# Patient Record
Sex: Female | Born: 1970 | Race: White | Hispanic: No | Marital: Married | Smoking: Former smoker
Health system: Southern US, Community
[De-identification: ages and names within clinical notes are randomized; demographics above are authoritative.]

## PROBLEM LIST (undated history)

## (undated) DIAGNOSIS — K900 Celiac disease: Secondary | ICD-10-CM

## (undated) DIAGNOSIS — D839 Common variable immunodeficiency, unspecified: Secondary | ICD-10-CM

## (undated) DIAGNOSIS — D696 Thrombocytopenia, unspecified: Secondary | ICD-10-CM

## (undated) DIAGNOSIS — R590 Localized enlarged lymph nodes: Secondary | ICD-10-CM

## (undated) DIAGNOSIS — R197 Diarrhea, unspecified: Secondary | ICD-10-CM

## (undated) DIAGNOSIS — E039 Hypothyroidism, unspecified: Secondary | ICD-10-CM

## (undated) HISTORY — DX: Celiac disease: K90.0

## (undated) HISTORY — PX: TONSILLECTOMY: SUR1361

## (undated) HISTORY — DX: Hypothyroidism, unspecified: E03.9

## (undated) HISTORY — DX: Diarrhea, unspecified: R19.7

## (undated) HISTORY — DX: Localized enlarged lymph nodes: R59.0

## (undated) HISTORY — DX: Thrombocytopenia, unspecified: D69.6

## (undated) HISTORY — DX: Common variable immunodeficiency, unspecified: D83.9

---

## 1997-02-05 HISTORY — PX: THYROID SURGERY: SHX805

## 2004-07-22 ENCOUNTER — Emergency Department: Payer: Self-pay | Admitting: Emergency Medicine

## 2005-01-31 ENCOUNTER — Inpatient Hospital Stay: Payer: Self-pay

## 2005-05-23 ENCOUNTER — Ambulatory Visit: Payer: Self-pay | Admitting: Gastroenterology

## 2006-07-03 ENCOUNTER — Ambulatory Visit: Payer: Self-pay

## 2006-07-08 DIAGNOSIS — D696 Thrombocytopenia, unspecified: Secondary | ICD-10-CM

## 2006-07-08 HISTORY — DX: Thrombocytopenia, unspecified: D69.6

## 2006-10-16 ENCOUNTER — Encounter: Payer: Self-pay | Admitting: Maternal & Fetal Medicine

## 2006-12-13 ENCOUNTER — Ambulatory Visit: Payer: Self-pay | Admitting: Emergency Medicine

## 2006-12-28 ENCOUNTER — Ambulatory Visit: Payer: Self-pay | Admitting: Emergency Medicine

## 2007-03-25 ENCOUNTER — Observation Stay: Payer: Self-pay | Admitting: Obstetrics and Gynecology

## 2007-04-15 ENCOUNTER — Inpatient Hospital Stay: Payer: Self-pay | Admitting: Obstetrics and Gynecology

## 2007-07-09 DIAGNOSIS — R590 Localized enlarged lymph nodes: Secondary | ICD-10-CM

## 2007-07-09 HISTORY — DX: Localized enlarged lymph nodes: R59.0

## 2007-11-06 ENCOUNTER — Ambulatory Visit: Payer: Self-pay | Admitting: Internal Medicine

## 2007-11-23 ENCOUNTER — Ambulatory Visit: Payer: Self-pay | Admitting: Internal Medicine

## 2007-12-07 ENCOUNTER — Ambulatory Visit: Payer: Self-pay | Admitting: Internal Medicine

## 2008-01-06 ENCOUNTER — Ambulatory Visit: Payer: Self-pay | Admitting: Internal Medicine

## 2008-01-06 HISTORY — PX: GALLBLADDER SURGERY: SHX652

## 2008-02-06 ENCOUNTER — Ambulatory Visit: Payer: Self-pay | Admitting: Internal Medicine

## 2008-02-10 ENCOUNTER — Ambulatory Visit: Payer: Self-pay | Admitting: Otolaryngology

## 2008-02-15 ENCOUNTER — Inpatient Hospital Stay: Payer: Self-pay | Admitting: Otolaryngology

## 2008-02-18 ENCOUNTER — Ambulatory Visit: Payer: Self-pay | Admitting: Otolaryngology

## 2008-02-29 ENCOUNTER — Other Ambulatory Visit: Payer: Self-pay

## 2008-02-29 ENCOUNTER — Emergency Department: Payer: Self-pay | Admitting: Emergency Medicine

## 2008-03-01 ENCOUNTER — Ambulatory Visit: Payer: Self-pay | Admitting: Otolaryngology

## 2008-03-08 ENCOUNTER — Ambulatory Visit: Payer: Self-pay | Admitting: Internal Medicine

## 2008-03-10 ENCOUNTER — Ambulatory Visit: Payer: Self-pay | Admitting: Otolaryngology

## 2008-04-07 ENCOUNTER — Ambulatory Visit: Payer: Self-pay | Admitting: Internal Medicine

## 2008-04-25 ENCOUNTER — Ambulatory Visit: Payer: Self-pay | Admitting: Internal Medicine

## 2008-05-08 ENCOUNTER — Ambulatory Visit: Payer: Self-pay | Admitting: Internal Medicine

## 2008-07-05 ENCOUNTER — Ambulatory Visit: Payer: Self-pay | Admitting: Specialist

## 2008-07-26 ENCOUNTER — Ambulatory Visit: Payer: Self-pay | Admitting: Internal Medicine

## 2008-08-08 ENCOUNTER — Ambulatory Visit: Payer: Self-pay | Admitting: Internal Medicine

## 2008-08-11 ENCOUNTER — Ambulatory Visit: Payer: Self-pay | Admitting: Internal Medicine

## 2008-08-22 ENCOUNTER — Ambulatory Visit: Payer: Self-pay | Admitting: Internal Medicine

## 2008-09-05 ENCOUNTER — Ambulatory Visit: Payer: Self-pay | Admitting: Internal Medicine

## 2008-11-05 ENCOUNTER — Ambulatory Visit: Payer: Self-pay | Admitting: Internal Medicine

## 2008-11-07 ENCOUNTER — Ambulatory Visit: Payer: Self-pay | Admitting: Internal Medicine

## 2008-11-08 ENCOUNTER — Inpatient Hospital Stay: Payer: Self-pay | Admitting: Internal Medicine

## 2008-12-06 ENCOUNTER — Ambulatory Visit: Payer: Self-pay | Admitting: Internal Medicine

## 2008-12-06 ENCOUNTER — Inpatient Hospital Stay: Payer: Self-pay | Admitting: Specialist

## 2008-12-13 ENCOUNTER — Ambulatory Visit: Payer: Self-pay | Admitting: Internal Medicine

## 2009-01-23 ENCOUNTER — Ambulatory Visit: Payer: Self-pay | Admitting: Internal Medicine

## 2009-02-08 ENCOUNTER — Ambulatory Visit: Payer: Self-pay | Admitting: Gastroenterology

## 2009-05-22 ENCOUNTER — Encounter: Payer: Self-pay | Admitting: Obstetrics & Gynecology

## 2009-07-08 LAB — HM PAP SMEAR: HM Pap smear: NORMAL

## 2009-07-11 ENCOUNTER — Inpatient Hospital Stay: Payer: Self-pay | Admitting: Internal Medicine

## 2009-08-07 ENCOUNTER — Encounter: Payer: Self-pay | Admitting: Maternal & Fetal Medicine

## 2009-08-08 ENCOUNTER — Encounter: Payer: Self-pay | Admitting: Maternal & Fetal Medicine

## 2009-08-10 ENCOUNTER — Emergency Department: Payer: Self-pay | Admitting: Emergency Medicine

## 2009-09-04 ENCOUNTER — Encounter: Payer: Self-pay | Admitting: Obstetrics and Gynecology

## 2009-09-05 ENCOUNTER — Encounter: Payer: Self-pay | Admitting: Pediatric Cardiology

## 2009-09-05 ENCOUNTER — Encounter: Payer: Self-pay | Admitting: Obstetrics and Gynecology

## 2009-09-14 ENCOUNTER — Inpatient Hospital Stay: Payer: Self-pay | Admitting: Internal Medicine

## 2009-09-25 ENCOUNTER — Encounter: Payer: Self-pay | Admitting: Obstetrics and Gynecology

## 2009-09-25 ENCOUNTER — Inpatient Hospital Stay: Payer: Self-pay | Admitting: Internal Medicine

## 2010-02-13 IMAGING — US ULTRASOUND RIGHT BREAST
1 series · 17 of 25 positions shown · non-contrast
Comparison: none

REASON FOR EXAM: new found right breast lump
COMMENTS:

[Series 1: ultrasound right breast · 17 of 38 slices shown]
[im 1/38]
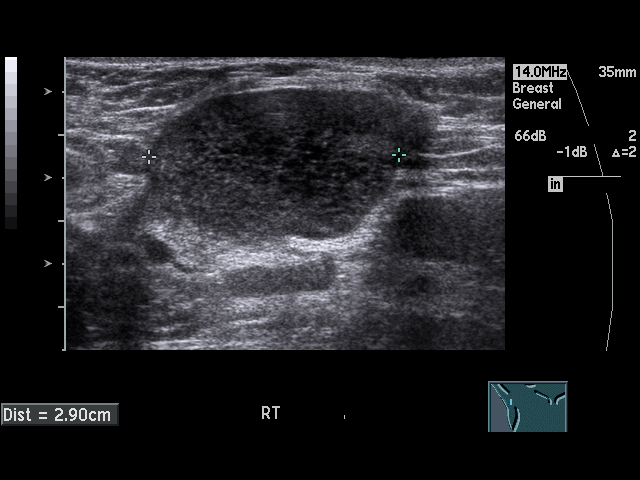
[im 4/38]
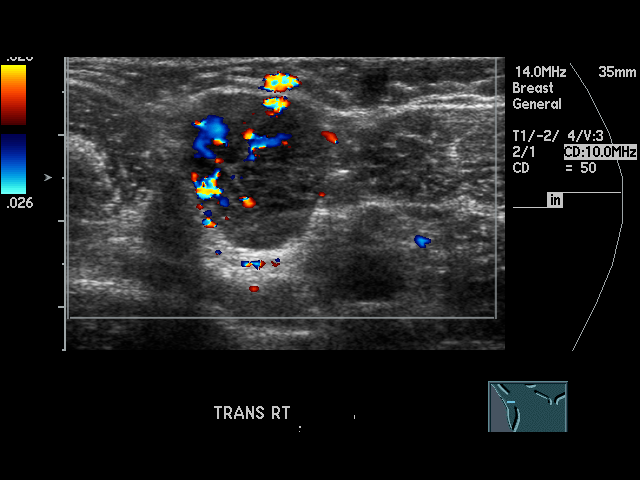
[im 5/38]
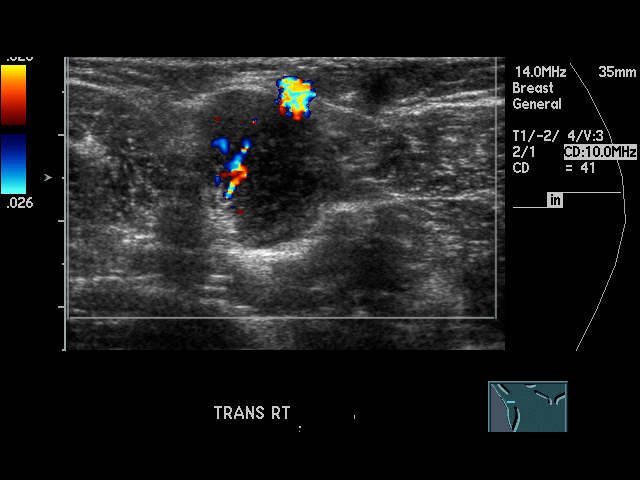
[im 8/38]
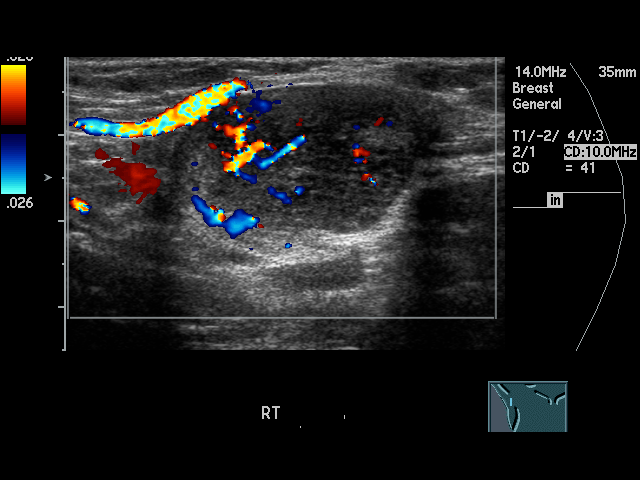
[im 10/38]
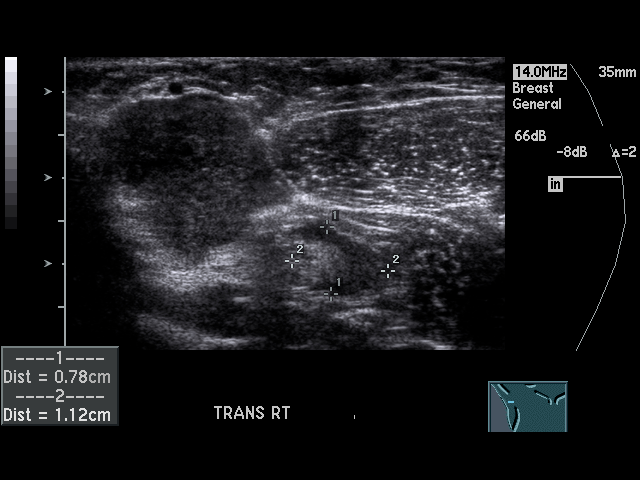
[im 13/38]
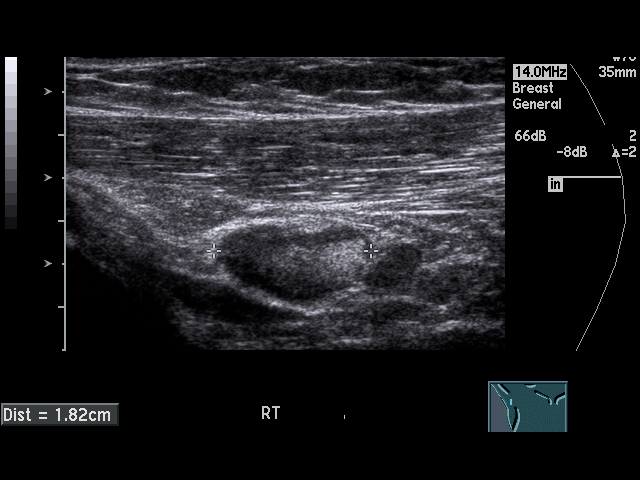
[im 14/38]
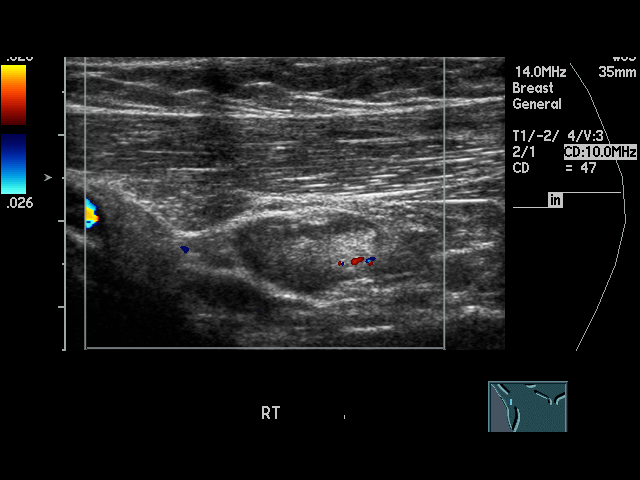
[im 17/38]
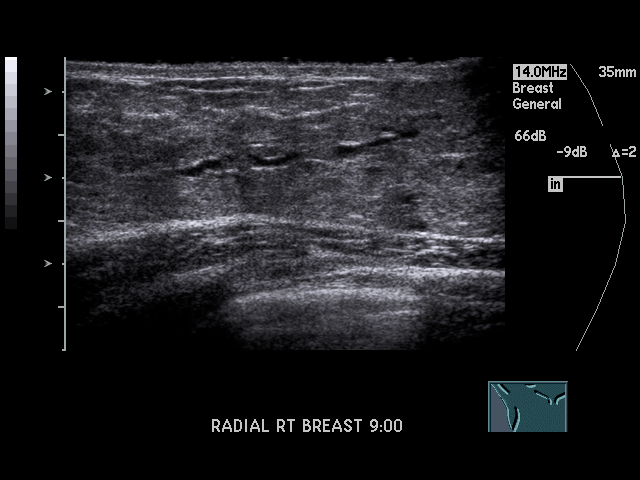
[im 19/38]
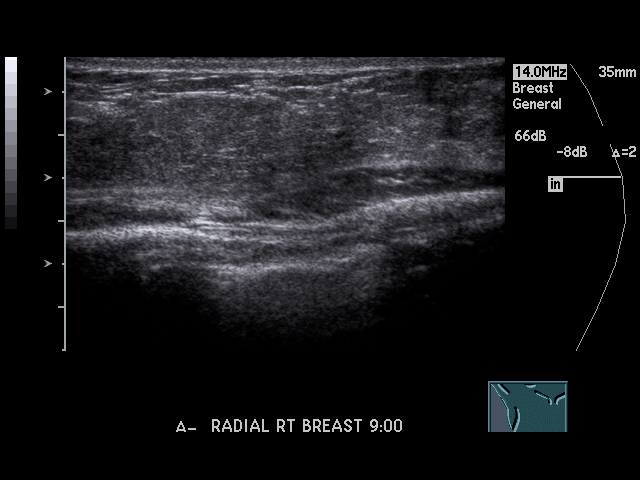
[im 21/38]
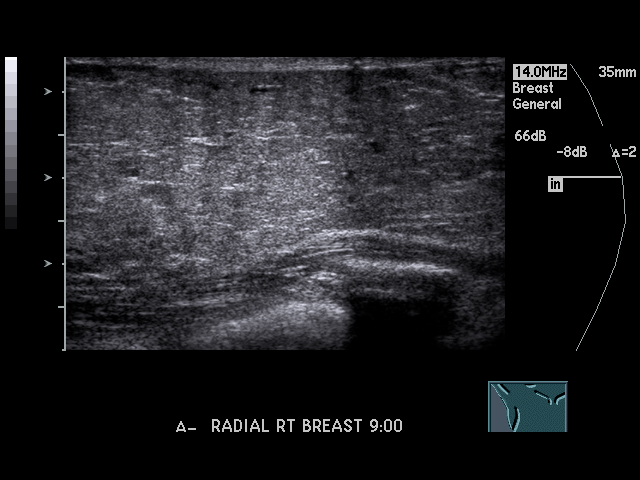
[im 24/38]
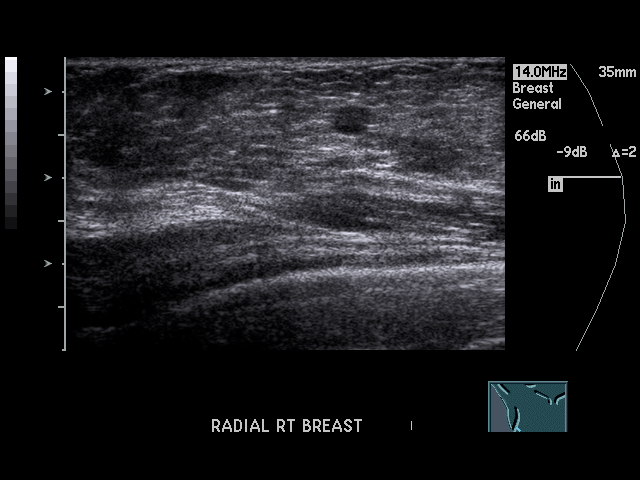
[im 25/38]
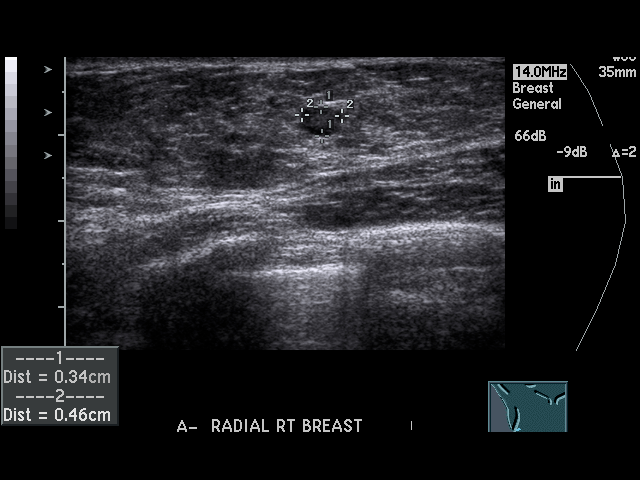
[im 28/38]
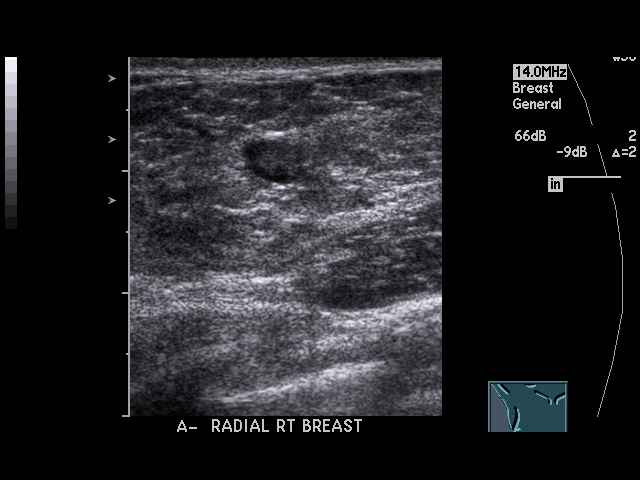
[im 30/38]
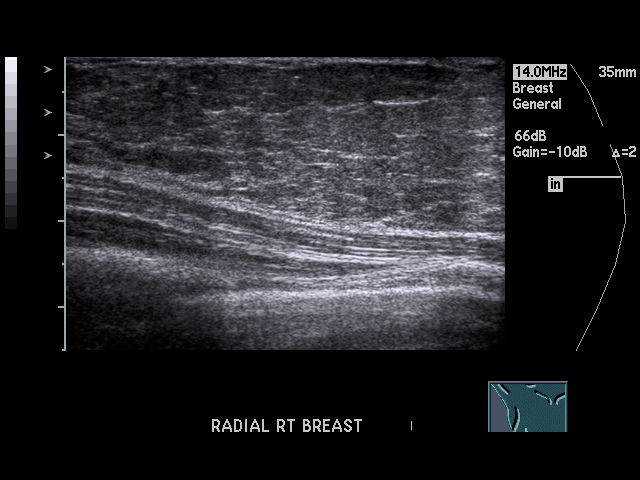
[im 33/38]
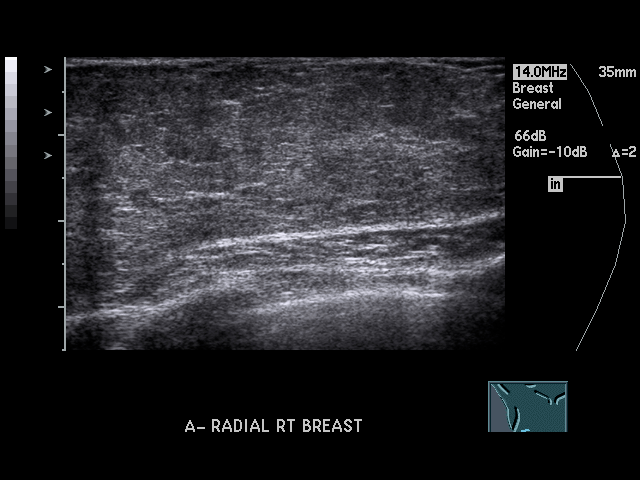
[im 34/38]
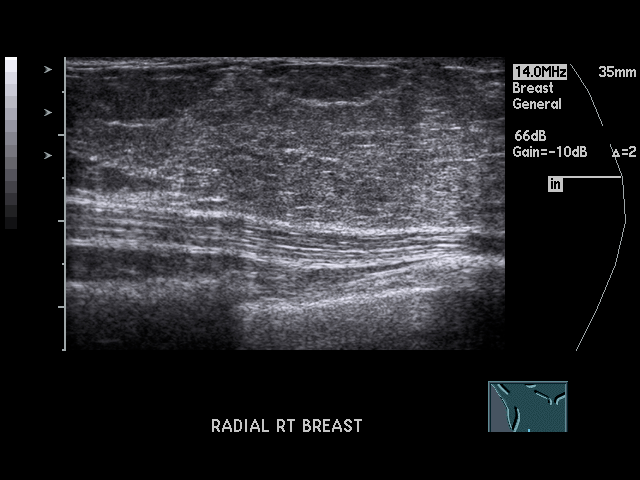
[im 38/38]
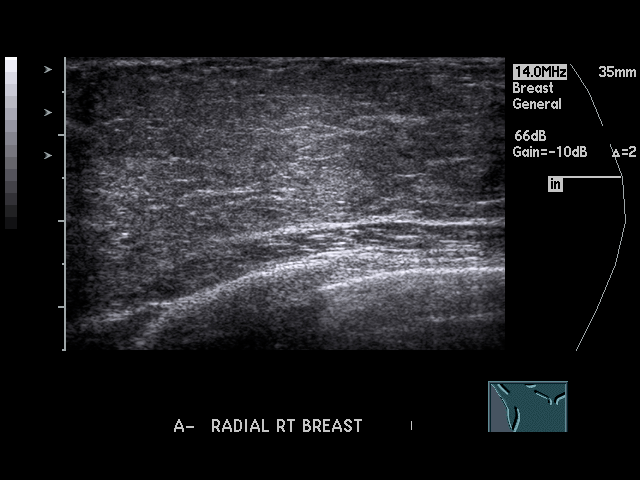

[17 of 25 positions shown; findings below may reference images not displayed]

PROCEDURE:     US  - US BREAST RIGHT  - November 24, 2007 [DATE]

RESULT:     Sonographic interrogation of the RIGHT axillary region and upper
outer quadrant of the RIGHT breast is performed. There is an enlarged lymph
node in the RIGHT axilla which is the palpable area of abnormality. This
measures 1.8 x 1.7 x 2.9 cm. A small elliptical smoothly marginated area of
decreased echogenicity is seen in the 10 o'clock region of the RIGHT breast
measuring 4.9 x 3.4 x 4.6 mm.  There appears to be a fatty hilum and this is
suggestive of an intramammary lymph node without enlargement. No other
significant mammographic abnormality is seen. A non-enlarged lymph node is
seen in the RIGHT axilla adjacent to the enlarged lymph node described
above.
IMPRESSION: 1.Adenopathy in the RIGHT axilla. No definite breast parenchymal sonographic
abnormality. Clinical correlation and follow-up is recommended.

BI-RADS: Category 2-Benign Finding

## 2010-06-07 ENCOUNTER — Ambulatory Visit: Payer: Self-pay | Admitting: Family Medicine

## 2010-06-08 ENCOUNTER — Inpatient Hospital Stay: Payer: Self-pay | Admitting: Internal Medicine

## 2010-06-27 ENCOUNTER — Inpatient Hospital Stay: Payer: Self-pay | Admitting: Internal Medicine

## 2010-07-16 ENCOUNTER — Ambulatory Visit: Payer: Self-pay | Admitting: Internal Medicine

## 2010-09-27 ENCOUNTER — Ambulatory Visit: Payer: Self-pay | Admitting: Internal Medicine

## 2010-09-28 ENCOUNTER — Inpatient Hospital Stay: Payer: Self-pay | Admitting: Internal Medicine

## 2010-10-05 ENCOUNTER — Inpatient Hospital Stay: Payer: Self-pay | Admitting: Internal Medicine

## 2010-11-06 DIAGNOSIS — D839 Common variable immunodeficiency, unspecified: Secondary | ICD-10-CM

## 2010-11-06 HISTORY — DX: Common variable immunodeficiency, unspecified: D83.9

## 2010-11-12 ENCOUNTER — Inpatient Hospital Stay: Payer: Self-pay | Admitting: Internal Medicine

## 2011-02-25 ENCOUNTER — Ambulatory Visit (INDEPENDENT_AMBULATORY_CARE_PROVIDER_SITE_OTHER): Payer: BC Managed Care – PPO | Admitting: Internal Medicine

## 2011-02-25 ENCOUNTER — Encounter: Payer: Self-pay | Admitting: Internal Medicine

## 2011-02-25 VITALS — BP 118/60 | HR 64 | Temp 98.7°F | Resp 14 | Ht 67.0 in | Wt 127.0 lb

## 2011-02-25 DIAGNOSIS — D839 Common variable immunodeficiency, unspecified: Secondary | ICD-10-CM

## 2011-02-25 DIAGNOSIS — K900 Celiac disease: Secondary | ICD-10-CM

## 2011-02-25 DIAGNOSIS — E039 Hypothyroidism, unspecified: Secondary | ICD-10-CM

## 2011-02-25 NOTE — Assessment & Plan Note (Addendum)
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 She has had initiation of IVIG therapy with good tolerance and clinical improvement.  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  I have signed her disability form which will need monthly submissions to her former employer to continue her disability benefits.

## 2011-02-25 NOTE — Progress Notes (Signed)
Subjective:    Patient ID: Ashley [name], female    DOB: [birth year], 40 y.o.   MRN: [id number]  HPI    Review of Systems     Objective:   Physical Exam        Assessment & Plan:   Subjective:     Ashley [name] is a 40 y.o. female who presents for follow up of hypothyroidism. Current symptoms: none. Patient denies change in energy level. Symptoms have stabilized.  The following portions of the patient's history were reviewed and updated as appropriate: allergies, current medications, past family history, past medical history, past social history, past surgical history and problem list.  Review of Systems A comprehensive review of systems was negative.    Objective:    BP 118/60  Pulse 64  Temp(Src) 98.7 F (37.1 C) (Oral)  Resp 14  Ht 5\' 7"  (1.702 m)  Wt 127 lb (57.607 kg)  BMI 19.89 kg/m2  LMP 02/08/2011  General Appearance:    Alert, cooperative, no distress, appears stated age  Head:    Normocephalic, without obvious abnormality, atraumatic  Eyes:    PERRL, conjunctiva/corneas clear, EOM's intact, fundi    benign, both eyes  Ears:    Normal TM's and external ear canals, both ears  Nose:   Nares normal, septum midline, mucosa normal, no drainage    or sinus tenderness  Throat:   Lips, mucosa, and tongue normal; teeth and gums normal  Neck:   Supple, symmetrical, trachea midline, no adenopathy;    thyroid:  no enlargement/tenderness/nodules; no carotid   bruit or JVD  Back:     Symmetric, no curvature, ROM normal, no CVA tenderness  Lungs:     Clear to auscultation bilaterally, respirations unlabored  Chest Wall:    No tenderness or deformity   Heart:    Regular rate and rhythm, S1 and S2 normal, no murmur, rub   or gallop     Abdomen:     Soft, non-tender, bowel sounds active all four quadrants,    no masses, no organomegaly        Extremities:   Extremities normal, atraumatic, no cyanosis or edema  Pulses:   2+ and symmetric all extremities  Skin:    Skin color, texture, turgor normal, no rashes or lesions  Lymph nodes:   Cervical, supraclavicular, and axillary nodes normal  Neurologic:   CNII-XII intact, normal strength, sensation and reflexes    throughout    Laboratory: No results found for this basename: TSH      Assessment:    Hypothyroidism.  Replacement therapy with Synthroid.      Plan:    1. L-thyroxine per orders. 2. Recheck thyroid function tests in 6 weeks. 3. Instructed not to take multivitamins or iron within 4 hours of taking thyroid medications. 4. Follow up in 3 months.   Subjective:     Ashley [name] is a 40 y.o. female who presents for follow up of hypothyroidism. Current symptoms: none. Patient denies change in energy level. Symptoms have stabilized.  The following portions of the patient's history were reviewed and updated as appropriate: allergies, current medications, past family history, past medical history, past social history, past surgical history and problem list.  Review of Systems Pertinent items are noted in HPI.    Objective:    BP 118/60  Pulse 64  Temp(Src) 98.7 F (37.1 C) (Oral)  Resp 14  Ht 5\' 7"  (1.702 m)  Wt 127 lb (57.607 kg)  BMI 19.89 kg/m2  LMP 02/08/2011  General Appearance:    Alert, cooperative, no distress, appears stated age  Head:    Normocephalic, without obvious abnormality, atraumatic  Eyes:    PERRL, conjunctiva/corneas clear, EOM's intact, fundi    benign, both eyes  Ears:    Normal TM's and external ear canals, both ears  Nose:   Nares normal, septum midline, mucosa normal, no drainage    or sinus tenderness  Throat:   Lips, mucosa, and tongue normal; teeth and gums normal  Neck:   Supple, symmetrical, trachea midline, no adenopathy;    thyroid:  no enlargement/tenderness/nodules; no carotid   bruit or JVD  Back:     Symmetric, no curvature, ROM normal, no CVA tenderness  Lungs:     Clear to auscultation bilaterally, respirations unlabored  Chest  Wall:    No tenderness or deformity   Heart:    Regular rate and rhythm, S1 and S2 normal, no murmur, rub   or gallop     Abdomen:     Soft, non-tender, bowel sounds active all four quadrants,    no masses, no organomegaly        Extremities:   Extremities normal, atraumatic, no cyanosis or edema  Pulses:   2+ and symmetric all extremities  Skin:   Skin color, texture, turgor normal, no rashes or lesions  Lymph nodes:   Cervical, supraclavicular, and axillary nodes normal  Neurologic:   CNII-XII intact, normal strength, sensation and reflexes    throughout    Laboratory:     Assessment:    Hypothyroidism.  Replacement therapy with Synthroid.    Plan:    1. L-thyroxine per orders. 2. Recheck thyroid function tests in 6 weeks. 3. Instructed not to take multivitamins or iron within 4 hours of taking thyroid medications. 4. Follow up in 3 months.

## 2011-02-25 NOTE — Assessment & Plan Note (Signed)
Diagnosed by endoscopy done in May for evaluation of Crohn's disease.  Records requested.

## 2011-02-25 NOTE — Assessment & Plan Note (Signed)
Her TSH was 62 in January and no subsequent followup lab is available.  Rechecking today on 200 mcg daily dose.

## 2011-02-26 NOTE — Progress Notes (Signed)
Addended by: Jobie Quaker on: 02/26/2011 03:29 PM   Modules accepted: Orders

## 2011-02-27 LAB — TSH: TSH: 0.23 u[IU]/mL — ABNORMAL LOW (ref 0.35–5.50)

## 2011-02-27 MED ORDER — LEVOTHYROXINE SODIUM 175 MCG PO TABS: 175.0000 ug | ORAL_TABLET | Freq: Every day | ORAL | Status: DC

## 2011-02-27 NOTE — Progress Notes (Signed)
Addended by: Duncan Dull on: 02/27/2011 04:06 PM   Modules accepted: Orders

## 2011-03-04 MED ORDER — LEVOTHYROXINE SODIUM 175 MCG PO TABS: 175.0000 ug | ORAL_TABLET | Freq: Every day | ORAL | Status: DC

## 2011-03-04 NOTE — Progress Notes (Signed)
Addended by: Darletta Moll A on: 03/04/2011 12:07 PM   Modules accepted: Orders

## 2011-04-11 ENCOUNTER — Other Ambulatory Visit (INDEPENDENT_AMBULATORY_CARE_PROVIDER_SITE_OTHER): Payer: BC Managed Care – PPO | Admitting: *Deleted

## 2011-04-11 DIAGNOSIS — Z0279 Encounter for issue of other medical certificate: Secondary | ICD-10-CM

## 2011-04-11 DIAGNOSIS — E039 Hypothyroidism, unspecified: Secondary | ICD-10-CM

## 2011-04-15 ENCOUNTER — Other Ambulatory Visit: Payer: Self-pay | Admitting: Internal Medicine

## 2011-04-15 MED ORDER — LEVOTHYROXINE SODIUM 150 MCG PO TABS: 150.0000 ug | ORAL_TABLET | Freq: Every day | ORAL | Status: DC

## 2011-06-14 DIAGNOSIS — Z0279 Encounter for issue of other medical certificate: Secondary | ICD-10-CM

## 2011-07-26 ENCOUNTER — Telehealth: Payer: Self-pay | Admitting: *Deleted

## 2011-07-26 NOTE — Telephone Encounter (Signed)
Pt has brought in disability forms for completion, forms are in your red folder.

## 2011-08-05 NOTE — Telephone Encounter (Signed)
Forms have been faxed to fax number [phone].

## 2011-08-06 NOTE — Telephone Encounter (Signed)
Spoke with patient, she doesn't want the original, originals sent for scanning.

## 2011-08-20 ENCOUNTER — Ambulatory Visit (INDEPENDENT_AMBULATORY_CARE_PROVIDER_SITE_OTHER): Payer: BC Managed Care – PPO | Admitting: Internal Medicine

## 2011-08-20 ENCOUNTER — Encounter: Payer: Self-pay | Admitting: Internal Medicine

## 2011-08-20 VITALS — BP 98/60 | HR 76 | Temp 98.2°F | Wt 121.0 lb

## 2011-08-20 DIAGNOSIS — K900 Celiac disease: Secondary | ICD-10-CM

## 2011-08-20 DIAGNOSIS — E039 Hypothyroidism, unspecified: Secondary | ICD-10-CM

## 2011-08-20 DIAGNOSIS — J40 Bronchitis, not specified as acute or chronic: Secondary | ICD-10-CM

## 2011-08-20 DIAGNOSIS — Z1239 Encounter for other screening for malignant neoplasm of breast: Secondary | ICD-10-CM

## 2011-08-20 MED ORDER — AZITHROMYCIN 500 MG PO TABS: 500.0000 mg | ORAL_TABLET | Freq: Every day | ORAL | Status: AC

## 2011-08-20 MED ORDER — HYDROCOD POLST-CHLORPHEN POLST 10-8 MG/5ML PO LQCR: 5.0000 mL | Freq: Two times a day (BID) | ORAL | Status: DC | PRN

## 2011-08-20 MED ORDER — PREDNISONE (PAK) 10 MG PO TABS: ORAL_TABLET | ORAL | Status: AC

## 2011-08-20 NOTE — Progress Notes (Signed)
Subjective:    Patient ID: Ashley [name], female    DOB: July 10, 1970, 41 y.o.   MRN: [id number]  HPI  Ashley [name] is a 40 yr ld white female recently diagnosed with CVID, now receiving treatment with weekly IVIg with marked improvement, no hospitalizations since last August, presents for followup on chronic conditions.  She underwent a liver biopsy recently due to splenomegaly which was noted during GI evaluation for a mass in stomach .  Everything has been benign and the liver biopsy is pending.  She had a Colonoscopy and EGD.  Dr Karel Jarvis is her gastroenterologist and he specializes in immunodeficiency.  She has not had a thyroid  Level checked in over 8 months. 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  Past Medical History  Diagnosis Date  . Thrombocytopenia 2008    CT scan done for enlarged spleen found diffuse LAD, axillary node biopsied and reactive, benign  . Reactive cervical lymphadenopathy 2009    negative neck, axillary and bone marrow biopsy  . Hypothyroidism   . 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 Celiac disease     by endoscopy May 2012   Current Outpatient Prescriptions on File Prior to Visit  Medication Sig Dispense Refill  . albuterol (PROVENTIL) (2.5 MG/3ML) 0.083% nebulizer solution Take 2.5 mg by nebulization every 6 (six) hours as needed. For wheezing       . albuterol (VENTOLIN HFA) 108 (90 BASE) MCG/ACT inhaler Inhale 2 puffs into the lungs 4 (four) times daily as needed.        . budesonide-formoterol (SYMBICORT) 160-4.5 MCG/ACT inhaler Inhale 2 puffs into the lungs 2 (two) times daily.        . Calcium Carb-Cholecalciferol (CALCIUM 1000 + D PO) Take 2 tablets by mouth daily.        . Immune Globulin, Human, (GAMUNEX  IV) Inject into the vein once a week.        . levothyroxine (SYNTHROID) 150 MCG tablet Take 1 tablet (150 mcg total) by mouth daily.  30 tablet  3  . budesonide (ENTOCORT EC) 3 MG 24 hr capsule Take 6 mg by mouth. Take 3 capsules by mouth every day for 4 weeks, then 2 capsules daily for 4 weeks, then 1 capsule daily for 4 weeks then stop.         Review of Systems  Constitutional: Negative for fever, chills and unexpected weight change.  HENT: Negative for hearing loss, ear pain, nosebleeds, congestion, sore throat, facial swelling, rhinorrhea, sneezing, mouth sores, trouble swallowing, neck pain, neck stiffness, voice change, postnasal drip, sinus pressure, tinnitus and ear discharge.   Eyes: Negative for pain, discharge, redness and visual disturbance.  Respiratory: Negative for cough, chest tightness, shortness of breath, wheezing and stridor.   Cardiovascular: Negative for chest pain, palpitations and leg swelling.  Musculoskeletal: Negative for myalgias and arthralgias.  Skin: Negative for color change and rash.  Neurological: Negative for dizziness, weakness, light-headedness and headaches.  Hematological: Negative for adenopathy.       Objective:   Physical Exam  Constitutional: She is oriented to person, place, and time. She appears well-developed and well-nourished.  HENT:  Mouth/Throat: Oropharynx is clear and moist.  Eyes: EOM are  normal. Pupils are equal, round, and reactive to light. No scleral icterus.  Neck: Normal range of motion. Neck supple. No JVD present. No thyromegaly present.  Cardiovascular: Normal rate, regular rhythm, normal heart sounds and intact distal pulses.   Pulmonary/Chest: Effort normal. She has wheezes. She has rales.  Abdominal: Soft. Bowel sounds are normal. She exhibits no mass. There is no tenderness.  Musculoskeletal: Normal range of motion. She exhibits no edema.  Lymphadenopathy:    She has no cervical adenopathy.  Neurological: She is alert  and oriented to person, place, and time.  Skin: Skin is warm and dry.  Psychiatric: She has a normal mood and affect.       Bronchitis Will treat with empiric antibiotics and steroids  Hypothyroidism She is due for a repeat TSH  Celiac disease She has had an improvement in her chronic skin condition with following a gluten free diet but has lost 6 lbs,  BMI < 19 currently    Updated Medication List Outpatient Encounter Prescriptions as of 08/20/2011  Medication Sig Dispense Refill  . albuterol (PROVENTIL) (2.5 MG/3ML) 0.083% nebulizer solution Take 2.5 mg by nebulization every 6 (six) hours as needed. For wheezing       . albuterol (VENTOLIN HFA) 108 (90 BASE) MCG/ACT inhaler Inhale 2 puffs into the lungs 4 (four) times daily as needed.        . budesonide-formoterol (SYMBICORT) 160-4.5 MCG/ACT inhaler Inhale 2 puffs into the lungs 2 (two) times daily.        . Calcium Carb-Cholecalciferol (CALCIUM 1000 + D PO) Take 2 tablets by mouth daily.        . Immune Globulin, Human, (GAMUNEX IV) Inject into the vein once a week.        . levothyroxine (SYNTHROID) 150 MCG tablet Take 1 tablet (150 mcg total) by mouth daily.  30 tablet  3  . azithromycin (ZITHROMAX) 500 MG tablet Take 1 tablet (500 mg total) by mouth daily.  7 tablet  0  . budesonide (ENTOCORT EC) 3 MG 24 hr capsule Take 6 mg by mouth. Take 3 capsules by mouth every day for 4 weeks, then 2 capsules daily for 4 weeks, then 1 capsule daily for 4 weeks then stop.       . chlorpheniramine-HYDROcodone (TUSSIONEX PENNKINETIC ER) 10-8 MG/5ML LQCR Take 5 mLs by mouth every 12 (twelve) hours as needed.  200 mL  1  . predniSONE (STERAPRED UNI-PAK) 10 MG tablet 6 tablets on Day 1 , then reduce by 1 tablet daily until gone  21 tablet  0    Assessment & Plan:

## 2011-08-21 ENCOUNTER — Encounter: Payer: Self-pay | Admitting: Internal Medicine

## 2011-08-21 NOTE — Assessment & Plan Note (Signed)
She is due for a repeat TSH

## 2011-08-21 NOTE — Assessment & Plan Note (Signed)
She has had an improvement in her chronic skin condition with following a gluten free diet but has lost 6 lbs,  BMI < 19 currently

## 2011-08-21 NOTE — Assessment & Plan Note (Signed)
Will treat with empiric antibiotics and steroids

## 2011-08-29 ENCOUNTER — Telehealth: Payer: Self-pay | Admitting: *Deleted

## 2011-08-29 NOTE — Telephone Encounter (Signed)
Disability form for patient is in red folder.

## 2011-09-02 DIAGNOSIS — Z0279 Encounter for issue of other medical certificate: Secondary | ICD-10-CM

## 2011-09-09 NOTE — Telephone Encounter (Signed)
Patient notified disability form is ready and has been faxed.

## 2011-10-18 ENCOUNTER — Telehealth: Payer: Self-pay | Admitting: Internal Medicine

## 2011-10-18 NOTE — Telephone Encounter (Signed)
Spoke with patient, she says that she has been having pain in lungs and that this has been going on for a while. She scheduled appt for Monday with Dr. Darrick Huntsman.

## 2011-10-18 NOTE — Telephone Encounter (Signed)
[phone] Pt came in today bringing her disability papers.  She mentioned she had right sided pain. From front to back.  She stated she had a long history of lung issues.  Sarcoidosis

## 2011-10-21 ENCOUNTER — Encounter: Payer: Self-pay | Admitting: Internal Medicine

## 2011-10-21 ENCOUNTER — Ambulatory Visit (INDEPENDENT_AMBULATORY_CARE_PROVIDER_SITE_OTHER): Payer: BC Managed Care – PPO | Admitting: Internal Medicine

## 2011-10-21 VITALS — BP 112/60 | HR 78 | Temp 98.7°F | Resp 16 | Wt 122.2 lb

## 2011-10-21 DIAGNOSIS — R079 Chest pain, unspecified: Secondary | ICD-10-CM | POA: Insufficient documentation

## 2011-10-21 DIAGNOSIS — Z1239 Encounter for other screening for malignant neoplasm of breast: Secondary | ICD-10-CM

## 2011-10-21 DIAGNOSIS — R1011 Right upper quadrant pain: Secondary | ICD-10-CM

## 2011-10-21 NOTE — Progress Notes (Signed)
Patient ID: Ashley [name], female   DOB: 12/19/1970, 41 y.o.   MRN: [id number]  Patient Active Problem List  Diagnoses  . Common variable immunodeficiency  . Celiac disease  . Hypothyroidism  . Bronchitis  . Chest pain    Subjective:  CC:   Chief Complaint  Patient presents with  . Pain    lung    HPI:   [name] a 41 y.o. female who presents with lung and back pain which began several weeks ago and feels like pressure,  Tightness in her right upper quadrant and right chest. The pain initially occurred in the middle of night.  It has been present daily and is aggravated by deep breathing but not movement.  Right side only.  Has been having night sweats which is new,  none in the past  week. Not wheezing or coughing,  Peak flows  have been  fine.   Still getting weekly IViG on Monday for treatment of  CVID.  The pain is aggravated by eating and has been told she had an enlarged gb in the past .  There is a strong FH of cholecystitis. Liver biopsy done in Feb at [facility] was done, no results available but per patient normal.    Past Medical History  Diagnosis Date  . Thrombocytopenia 2008    CT scan done for enlarged spleen found diffuse LAD, axillary node biopsied and reactive, benign  . Reactive cervical lymphadenopathy 2009    negative neck, axillary and bone marrow biopsy  . Hypothyroidism   . 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 Celiac disease     by endoscopy May 2012    Past Surgical History  Procedure Date  . Tonsillectomy   . Thyroid surgery 1998, August    Goiter,   Total thyroid/parathyroid  . Gallbladder surgery July 2009    spleen enlarged         The following portions of the patient's history were reviewed and updated as appropriate: Allergies, current medications, and problem list.    Review of Systems:   12 Pt  review of systems was  negative except those addressed in the HPI,     History   Social History  . Marital Status: Married    Spouse Name: N/A    Number of Children: 4  . Years of Education: N/A   Occupational History  . Teacher     Full-time, First Grade   Social History Main Topics  . Smoking status: Former Smoker    Types: Cigarettes    Quit date: 02/24/1997  . Smokeless tobacco: Never Used  . Alcohol Use: Yes     occasional  . Drug Use: No  . Sexually Active: Not on file   Other Topics Concern  . Not on file   Social History Narrative  . No narrative on file    Objective:  BP 112/60  Pulse 78  Temp(Src) 98.7 F (37.1 C) (Oral)  Resp 16  Wt 122 lb 4 oz (55.452 kg)  SpO2 100%  LMP 08/12/2011  General appearance: alert, cooperative and appears stated age Ears: normal TM's and external ear canals both ears Throat: lips, mucosa, and tongue normal; teeth and gums normal Neck: no adenopathy, no carotid bruit, supple, symmetrical, trachea midline and thyroid not enlarged, symmetric, no tenderness/mass/nodules Back: symmetric, no curvature. ROM normal. No CVA  tenderness. Lungs: clear to auscultation bilaterally Heart: regular rate and rhythm, S1, S2 normal, no murmur, click, rub or gallop Abdomen: soft, non-tender; bowel sounds normal; no masses,  no organomegaly Pulses: 2+ and symmetric Skin: Skin color, texture, turgor normal. No rashes or lesions Lymph nodes: Cervical, supraclavicular, and axillary nodes normal.  Assessment and Plan:  Chest pain Her pain is atypical and not associated with the common pulmonary symptoms including dyspnea cough or wheeze especially unusual for her given her history of sarcoid and asthma. She has not had a chest x-ray however in over a month so this will be done today. Also concerned about gallbladder disease and she has a history of a large gallbladder a strong family history of cholecystitis and pain that is aggravated by eating. She had normal labs  recently at [facility] so we'll not repeat those at this time.    Updated Medication List Outpatient Encounter Prescriptions as of 10/21/2011  Medication Sig Dispense Refill  . albuterol (PROVENTIL) (2.5 MG/3ML) 0.083% nebulizer solution Take 2.5 mg by nebulization every 6 (six) hours as needed. For wheezing       . albuterol (VENTOLIN HFA) 108 (90 BASE) MCG/ACT inhaler Inhale 2 puffs into the lungs 4 (four) times daily as needed.        Marland Kitchen azaTHIOprine (IMURAN) 50 MG tablet Take 100 mg by mouth daily.      . budesonide-formoterol (SYMBICORT) 160-4.5 MCG/ACT inhaler Inhale 2 puffs into the lungs 2 (two) times daily.        . Calcium Carb-Cholecalciferol (CALCIUM 1000 + D PO) Take 2 tablets by mouth daily.        . Immune Globulin, Human, (GAMUNEX IV) Inject into the vein once a week.        . levothyroxine (SYNTHROID) 150 MCG tablet Take 1 tablet (150 mcg total) by mouth daily.  30 tablet  3  . DISCONTD: budesonide (ENTOCORT EC) 3 MG 24 hr capsule Take 6 mg by mouth. Take 3 capsules by mouth every day for 4 weeks, then 2 capsules daily for 4 weeks, then 1 capsule daily for 4 weeks then stop.       Marland Kitchen DISCONTD: chlorpheniramine-HYDROcodone (TUSSIONEX PENNKINETIC ER) 10-8 MG/5ML LQCR Take 5 mLs by mouth every 12 (twelve) hours as needed.  200 mL  1     Orders Placed This Encounter  Procedures  . MM Digital Screening  . DG Chest 2 View  . US Abdomen Complete    No Follow-up on file.

## 2011-10-21 NOTE — Assessment & Plan Note (Signed)
Her pain is atypical and not associated with the common pulmonary symptoms including dyspnea cough or wheeze especially unusual for her given her history of sarcoid and asthma. She has not had a chest x-ray however in over a month so this will be done today. Also concerned about gallbladder disease and she has a history of a large gallbladder a strong family history of cholecystitis and pain that is aggravated by eating. She had normal labs recently at [facility] so we'll not repeat those at this time.

## 2011-10-22 ENCOUNTER — Ambulatory Visit: Payer: Self-pay | Admitting: Internal Medicine

## 2011-10-22 ENCOUNTER — Telehealth: Payer: Self-pay | Admitting: Internal Medicine

## 2011-10-22 NOTE — Telephone Encounter (Signed)
Patient notified, she does not want to try a muscle relaxer.

## 2011-10-22 NOTE — Telephone Encounter (Signed)
And the abdominal ultrasound was normal too,  Nothing to suggest gallbladder is the cause of her pain

## 2011-10-22 NOTE — Telephone Encounter (Signed)
Chest xray was normal.  I do not have a cause for her chest discomfort.  Would she like to try a mild muscle relaxer?  robaxin 500 mg generic ok one tablet every 8 hours prn spasm  #30   0 refills

## 2011-10-22 NOTE — Telephone Encounter (Signed)
Her chest xray was normal -

## 2011-10-31 ENCOUNTER — Encounter: Payer: Self-pay | Admitting: Internal Medicine

## 2011-11-19 ENCOUNTER — Telehealth: Payer: Self-pay | Admitting: Internal Medicine

## 2011-11-19 MED ORDER — LEVOTHYROXINE SODIUM 150 MCG PO TABS: 150.0000 ug | ORAL_TABLET | Freq: Every day | ORAL | Status: DC

## 2011-11-19 NOTE — Telephone Encounter (Signed)
[phone] Pt called to get refill for synthroid   [facility] in [place]  Pt stated that drug store has been trying to get  Refill rx for a week Pt had labs done in feb  With dr terrance @ [facility] Pt is completely out

## 2011-11-19 NOTE — Telephone Encounter (Signed)
Done

## 2012-01-01 ENCOUNTER — Telehealth: Payer: Self-pay | Admitting: Internal Medicine

## 2012-01-01 MED ORDER — LEVOTHYROXINE SODIUM 175 MCG PO TABS: 175.0000 ug | ORAL_TABLET | Freq: Every day | ORAL | Status: DC

## 2012-01-01 NOTE — Telephone Encounter (Signed)
Patient notified rx sent in.

## 2012-01-01 NOTE — Telephone Encounter (Signed)
Recent [facility] labs indicate that her thyrod muedication needs increasing,  Confirm that she has been taking it correctly (30 minutes at least prior to food an d ogther meds in the morning) If correct,  Call in new dose of levothyoxineof 175 mcg  One tablet daily #30 3 refills to her pharmacy and update chart .  She will need repeat TSH after about 6 weeks.  thanks

## 2012-01-21 ENCOUNTER — Encounter: Payer: Self-pay | Admitting: Internal Medicine

## 2012-01-21 ENCOUNTER — Ambulatory Visit (INDEPENDENT_AMBULATORY_CARE_PROVIDER_SITE_OTHER): Payer: BC Managed Care – PPO | Admitting: Internal Medicine

## 2012-01-21 VITALS — BP 100/60 | HR 86 | Temp 99.0°F | Wt 120.0 lb

## 2012-01-21 DIAGNOSIS — D839 Common variable immunodeficiency, unspecified: Secondary | ICD-10-CM

## 2012-01-21 DIAGNOSIS — K900 Celiac disease: Secondary | ICD-10-CM

## 2012-01-21 MED ORDER — CLOBETASOL PROPIONATE 0.05 % EX OINT: TOPICAL_OINTMENT | Freq: Two times a day (BID) | CUTANEOUS | Status: AC

## 2012-01-21 NOTE — Progress Notes (Signed)
Patient ID: Ashley [name], female   DOB: [birth year], 41 y.o.   MRN: [id number]  Patient Active Problem List  Diagnosis  . Common variable immunodeficiency  . Celiac disease  . Hypothyroidism  . Bronchitis  . Chest pain    Subjective:  CC:   Chief Complaint  Patient presents with  . Follow-up    HPI:   Ashley [name] a 42 y.o. 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 She has had trouble following the gluten free diet lately .  She has not been hospitalized since the diagnosis was made and she stopped working at the [employer].  She is moving to [place] for a year trial with her family tomorrow .      Past Medical History  Diagnosis Date  . Thrombocytopenia 2008    CT scan done for enlarged spleen found diffuse LAD, axillary node biopsied and reactive, benign  . Reactive cervical lymphadenopathy 2009    negative neck, axillary and bone marrow biopsy  . Hypothyroidism   . 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 Common variable immunodeficiency May 2012    Diagnosed at [facility]  . Celiac disease     by endoscopy May 2012    Past Surgical History  Procedure Date  . Tonsillectomy   . Thyroid surgery 1998, August    Goiter,   Total thyroid/parathyroid  . Gallbladder surgery July 2009    spleen enlarged         The following portions of the patient's history were reviewed and updated as appropriate: Allergies, current medications, and problem list.    Review of Systems:   12 Pt  review of systems was negative except those addressed in the HPI,     History   Social History  . Marital Status: Married    Spouse Name: N/A    Number of Children: 4  . Years of Education: N/A   Occupational History  . Teacher     Full-time, First Grade   Social History Main Topics  . Smoking status: Former Smoker    Types: Cigarettes    Quit date:  02/24/1997  . Smokeless tobacco: Never Used  . Alcohol Use: Yes     occasional  . Drug Use: No  . Sexually Active: Not on file   Other Topics Concern  . Not on file   Social History Narrative  . No narrative on file    Objective:  BP 100/60  Pulse 86  Temp 99 F (37.2 C) (Oral)  Wt 120 lb (54.432 kg)  SpO2 99%  LMP 01/13/2012  General appearance: alert, cooperative and appears stated age Ears: normal TM's and external ear canals both ears Throat: lips, mucosa, and tongue normal; teeth and gums normal Neck: no adenopathy, no carotid bruit, supple, symmetrical, trachea midline and thyroid not enlarged, symmetric, no tenderness/mass/nodules Back: symmetric, no curvature. ROM normal. No CVA tenderness. Lungs: clear to auscultation bilaterally Heart: regular rate and rhythm, S1, S2 normal, no murmur, click, rub or gallop Abdomen: soft, non-tender; bowel sounds normal; no masses,  no organomegaly Pulses: 2+ and symmetric Skin: Skin color, texture, turgor normal. No rashes or lesions Lymph nodes: Cervical, supraclavicular, and axillary nodes normal.  Assessment and Plan:  Common variable immunodeficiency Diagnosed at [facility] , managed with weekly IvIG.  She is doing very well since receiving treatment and has not been hospitalized in  over a year.   Celiac disease Managed with gluten free diet.    Updated Medication List Outpatient Encounter Prescriptions as of 01/21/2012  Medication Sig Dispense Refill  . albuterol (PROVENTIL) (2.5 MG/3ML) 0.083% nebulizer solution Take 2.5 mg by nebulization every 6 (six) hours as needed. For wheezing       . albuterol (VENTOLIN HFA) 108 (90 BASE) MCG/ACT inhaler Inhale 2 puffs into the lungs 4 (four) times daily as needed.        Marland Kitchen azaTHIOprine (IMURAN) 50 MG tablet Take 100 mg by mouth daily.      . budesonide-formoterol (SYMBICORT) 160-4.5 MCG/ACT inhaler Inhale 2 puffs into the lungs 2 (two) times daily.        . Immune Globulin, Human,  (GAMUNEX IV) Inject into the vein once a week.        . levothyroxine (SYNTHROID, LEVOTHROID) 175 MCG tablet Take 1 tablet (175 mcg total) by mouth daily.  30 tablet  3  . Calcium Carb-Cholecalciferol (CALCIUM 1000 + D PO) Take 2 tablets by mouth daily.        . clobetasol ointment (TEMOVATE) 0.05 % Apply topically 2 (two) times daily.  60 g  1  . levothyroxine (SYNTHROID) 150 MCG tablet Take 1 tablet (150 mcg total) by mouth daily.  30 tablet  3     No orders of the defined types were placed in this encounter.    No Follow-up on file.

## 2012-01-21 NOTE — Patient Instructions (Addendum)
Keep in touch with Mychart,

## 2012-01-22 ENCOUNTER — Encounter: Payer: Self-pay | Admitting: Internal Medicine

## 2012-01-22 NOTE — Assessment & Plan Note (Signed)
Managed with gluten free diet 

## 2012-01-22 NOTE — Assessment & Plan Note (Signed)
Diagnosed at [facility] , managed with weekly IvIG.  She is doing very well since receiving treatment and has not been hospitalized in over a year.

## 2012-04-14 ENCOUNTER — Telehealth: Payer: Self-pay | Admitting: Internal Medicine

## 2012-04-14 ENCOUNTER — Encounter: Payer: Self-pay | Admitting: Internal Medicine

## 2012-04-14 NOTE — Telephone Encounter (Signed)
Opened in error pt will use my chart

## 2012-05-06 ENCOUNTER — Encounter: Payer: Self-pay | Admitting: Internal Medicine

## 2012-05-26 ENCOUNTER — Encounter: Payer: Self-pay | Admitting: Internal Medicine

## 2012-05-26 ENCOUNTER — Ambulatory Visit (INDEPENDENT_AMBULATORY_CARE_PROVIDER_SITE_OTHER): Payer: BC Managed Care – PPO | Admitting: Internal Medicine

## 2012-05-26 VITALS — BP 98/60 | HR 83 | Temp 98.1°F | Resp 12 | Ht 69.0 in | Wt 124.5 lb

## 2012-05-26 DIAGNOSIS — K900 Celiac disease: Secondary | ICD-10-CM

## 2012-05-26 DIAGNOSIS — J40 Bronchitis, not specified as acute or chronic: Secondary | ICD-10-CM

## 2012-05-26 DIAGNOSIS — Z23 Encounter for immunization: Secondary | ICD-10-CM

## 2012-05-26 DIAGNOSIS — D839 Common variable immunodeficiency, unspecified: Secondary | ICD-10-CM

## 2012-05-26 MED ORDER — AZITHROMYCIN 500 MG PO TABS: 500.0000 mg | ORAL_TABLET | Freq: Every day | ORAL | Status: DC

## 2012-05-26 MED ORDER — PREDNISONE (PAK) 10 MG PO TABS: ORAL_TABLET | ORAL | Status: DC

## 2012-05-27 ENCOUNTER — Encounter: Payer: Self-pay | Admitting: Internal Medicine

## 2012-05-27 NOTE — Assessment & Plan Note (Signed)
Her recent episode resolved without intervention. I have given her prescriptions for azithromycin and prednisone in the event that she has a future episode before she is able to establish care with a primary care provider in [place]. 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

## 2012-05-27 NOTE — Assessment & Plan Note (Addendum)
Managed with weekly IVIG infusions which patient has delivered to her home. Since she has retired from [employer] system she has had no hospitalizations. It is my impression and opinion that she should remain out of the public sector in terms of work options due to her compromised immune status which continually led to hospitalizations for respiratory failure.

## 2012-05-27 NOTE — Assessment & Plan Note (Addendum)
Managed with gluten-free diet and azathioprine. However due to increased stooling and leukopenia her medications being changed to CellCept.Marland Kitchen

## 2012-05-27 NOTE — Progress Notes (Signed)
Patient ID: Ashley [name], female   DOB: Jun 29, 1971, 41 y.o.   MRN: [id number]  Patient Active Problem List  Diagnosis  . Common variable immunodeficiency  . Celiac disease  . Hypothyroidism  . Bronchitis  . Chest pain    Subjective:  CC:   Chief Complaint  Patient presents with  . Follow-up    HPI:   Ashley [name] a 41 y.o. 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 Patient has been living in [place] for the last 3 months with her family. She has returned to [place] for her disability hearing. She had no trouble continuing her home IVIG infusions to maintain remission of her her disease state. She did have a respiratory illness last week which she treated without antibiotics or prednisone. Symptoms have resolved with supportive care. She does not have a primary care provider yet. She was also seen by allergist Dr. Latrelle Dodrill at [facility] during her trip to [facility].  She has had an increased number of stools up to 10 daily since the azathioprine was reduced She was noted to have persistent leukopenia despite reduction in her dose of azathioprine and as a result of immunosuppressive therapy is being changed to CellCept.. She has been having more fatigue and stiffness but no joint swelling.    Past Medical History  Diagnosis Date  . Thrombocytopenia 2008    CT scan done for enlarged spleen found diffuse LAD, axillary node biopsied and reactive, benign  . Reactive cervical lymphadenopathy 2009    negative neck, axillary and bone marrow biopsy  . Hypothyroidism   . 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 Common variable immunodeficiency May 2012    Diagnosed at [facility]  . Celiac disease     by endoscopy May 2012    Past Surgical History  Procedure Date  . Tonsillectomy   . Thyroid surgery 1998, August    Goiter,   Total thyroid/parathyroid  . Gallbladder surgery July 2009      spleen enlarged         The following portions of the patient's history were reviewed and updated as appropriate: Allergies, current medications, and problem list.    Review of Systems:   Review of Systems  Constitutional: Positive for malaise/fatigue. Negative for fever and weight loss.  HENT: Negative for ear pain.   Eyes: Negative for blurred vision.  Respiratory: Positive for cough. Negative for wheezing.   Cardiovascular: Negative for chest pain and claudication.  Gastrointestinal: Positive for diarrhea.  Genitourinary: Negative for dysuria.  Musculoskeletal: Positive for joint pain.  Neurological: Negative for dizziness and headaches.  Endo/Heme/Allergies: Does not bruise/bleed easily.  Psychiatric/Behavioral: The patient does not have insomnia.        History   Social History  . Marital Status: Married    Spouse Name: N/A    Number of Children: 4  . Years of Education: N/A   Occupational History  . Teacher     Full-time, First Grade   Social History Main Topics  . Smoking status: Former Smoker    Types: Cigarettes    Quit date: 02/24/1997  . Smokeless tobacco: Never Used  . Alcohol Use: Yes     Comment: occasional  . Drug Use: No  . Sexually Active: Not on file   Other Topics Concern  . Not on file   Social History Narrative  . No narrative on file    Objective:  BP 98/60  Pulse 83  Temp 98.1 F (36.7 C) (Oral)  Resp 12  Ht 5\' 9"  (1.753 m)  Wt 124 lb 8 oz (56.473 kg)  BMI 18.39 kg/m2  SpO2 99%  General appearance: alert, cooperative and appears stated age Ears: normal TM's and external ear canals both ears Throat: lips, mucosa, and tongue normal; teeth and gums normal Neck: no adenopathy, no carotid bruit, supple, symmetrical, trachea midline and thyroid not enlarged, symmetric, no tenderness/mass/nodules Back: symmetric, no curvature. ROM normal. No CVA tenderness. Lungs: clear to auscultation bilaterally Heart: regular rate and  rhythm, S1, S2 normal, no murmur, click, rub or gallop Abdomen: soft, non-tender; bowel sounds normal; no masses,  no organomegaly Pulses: 2+ and symmetric Skin: Skin color, texture, turgor normal. No rashes or lesions Lymph nodes: Cervical, supraclavicular, and axillary nodes normal.  Assessment and Plan:  Bronchitis Her recent episode resolved without intervention. I have given her prescriptions for azithromycin and prednisone in the event that she has a future episode before she is able to establish care with a primary care provider in [place]. 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  Celiac disease Managed with gluten-free diet and azathioprine. However due to increased stooling and leukopenia her medications being changed to CellCept..   Common variable immunodeficiency Managed with weekly IVIG infusions which patient has delivered to her home. Since she has retired from [employer] system she has had no hospitalizations. It is my impression and opinion that she should remain out of the public sector in terms of work options due to her compromised immune status which continually led to hospitalizations for respiratory failure.   Updated Medication List Outpatient Encounter Prescriptions as of 05/26/2012  Medication Sig Dispense Refill  . albuterol (PROVENTIL) (2.5 MG/3ML) 0.083% nebulizer solution Take 2.5 mg by nebulization every 6 (six) hours as needed. For wheezing       . albuterol (VENTOLIN HFA) 108 (90 BASE) MCG/ACT inhaler Inhale 2 puffs into the lungs 4 (four) times daily as needed.        Marland Kitchen azaTHIOprine (IMURAN) 50 MG tablet Take 100 mg by mouth daily.      . clobetasol ointment (TEMOVATE) 0.05 % Apply topically 2 (two) times daily.  60 g  1  . Immune Globulin, Human, (GAMUNEX IV) Inject into the vein once a week.        . levothyroxine (SYNTHROID) 150 MCG tablet Take 1 tablet  (150 mcg total) by mouth daily.  30 tablet  3  . azithromycin (ZITHROMAX) 500 MG tablet Take 1 tablet (500 mg total) by mouth daily.  7 tablet  0  . budesonide-formoterol (SYMBICORT) 160-4.5 MCG/ACT inhaler Inhale 2 puffs into the lungs 2 (two) times daily.        . Calcium Carb-Cholecalciferol (CALCIUM 1000 + D PO) Take 2 tablets by mouth daily.        Marland Kitchen levothyroxine (SYNTHROID, LEVOTHROID) 175 MCG tablet Take 1 tablet (175 mcg total) by mouth daily.  30 tablet  3  . predniSONE (STERAPRED UNI-PAK) 10 MG tablet 6 tablets on day 1, decrease by tablet daily until gone  21 tablet  0  . [DISCONTINUED] predniSONE (STERAPRED UNI-PAK) 10 MG tablet 6 tablets on day 1, decrease by tablet daily until gone  21 tablet  0     Orders Placed This Encounter  Procedures  . Flu vaccine greater than or equal to 3yo preservative free IM  No Follow-up on file.

## 2012-05-28 ENCOUNTER — Encounter: Payer: Self-pay | Admitting: Internal Medicine

## 2012-06-23 ENCOUNTER — Other Ambulatory Visit: Payer: Self-pay | Admitting: Internal Medicine

## 2012-07-23 ENCOUNTER — Other Ambulatory Visit: Payer: Self-pay | Admitting: Internal Medicine

## 2012-10-05 IMAGING — CR DG CHEST 2V
1 series · 2 of 2 positions shown · non-contrast
Comparison: none

REASON FOR EXAM: pneumonia
COMMENTS:

PROCEDURE:     DXR - DXR CHEST PA (OR AP) AND LATERAL  - July 16, 2010  [DATE]
RESULT:     Comparison is made to the study of 06/29/2010.
The basal pneumonia demonstrated previously has resolved. The heart is
normal. There is no edema, infiltrate, effusion or pneumothorax.

[Series 1: view not recorded · 0.17mm/px · 2 of 2 slices shown]
[im 1/2]
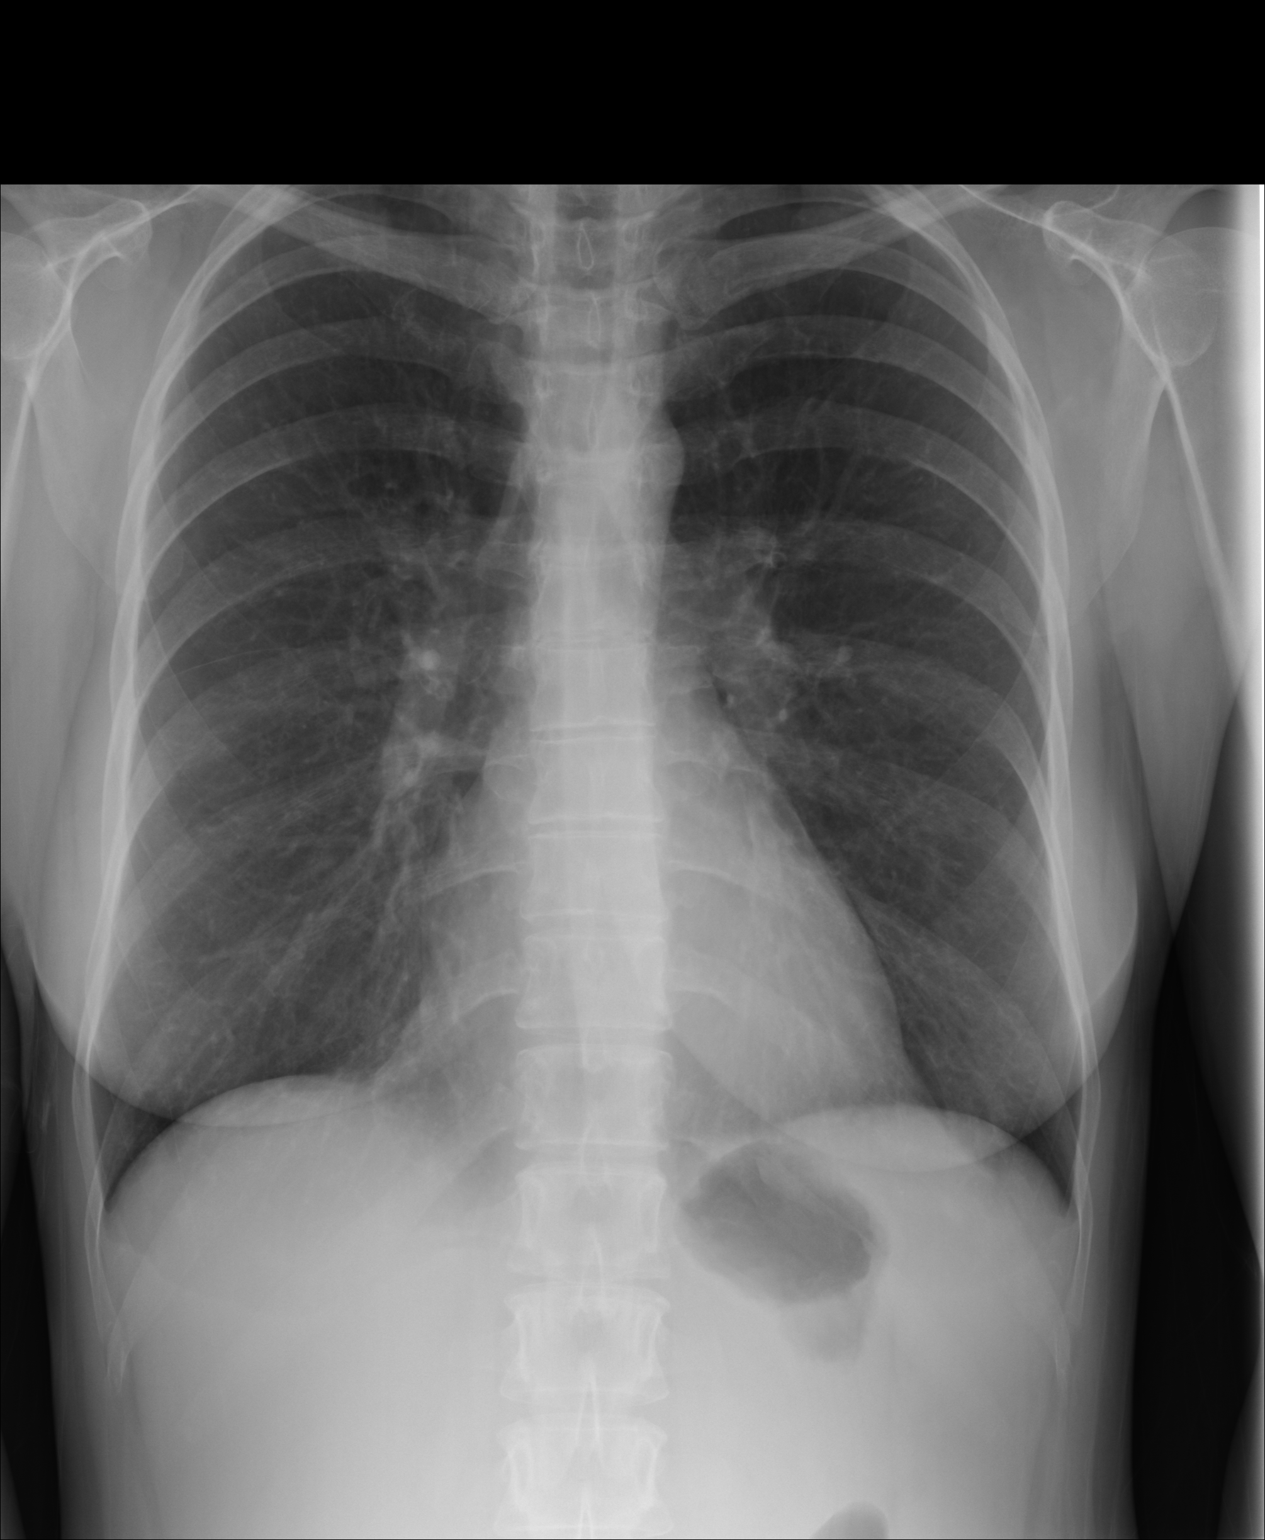
[im 2/2]
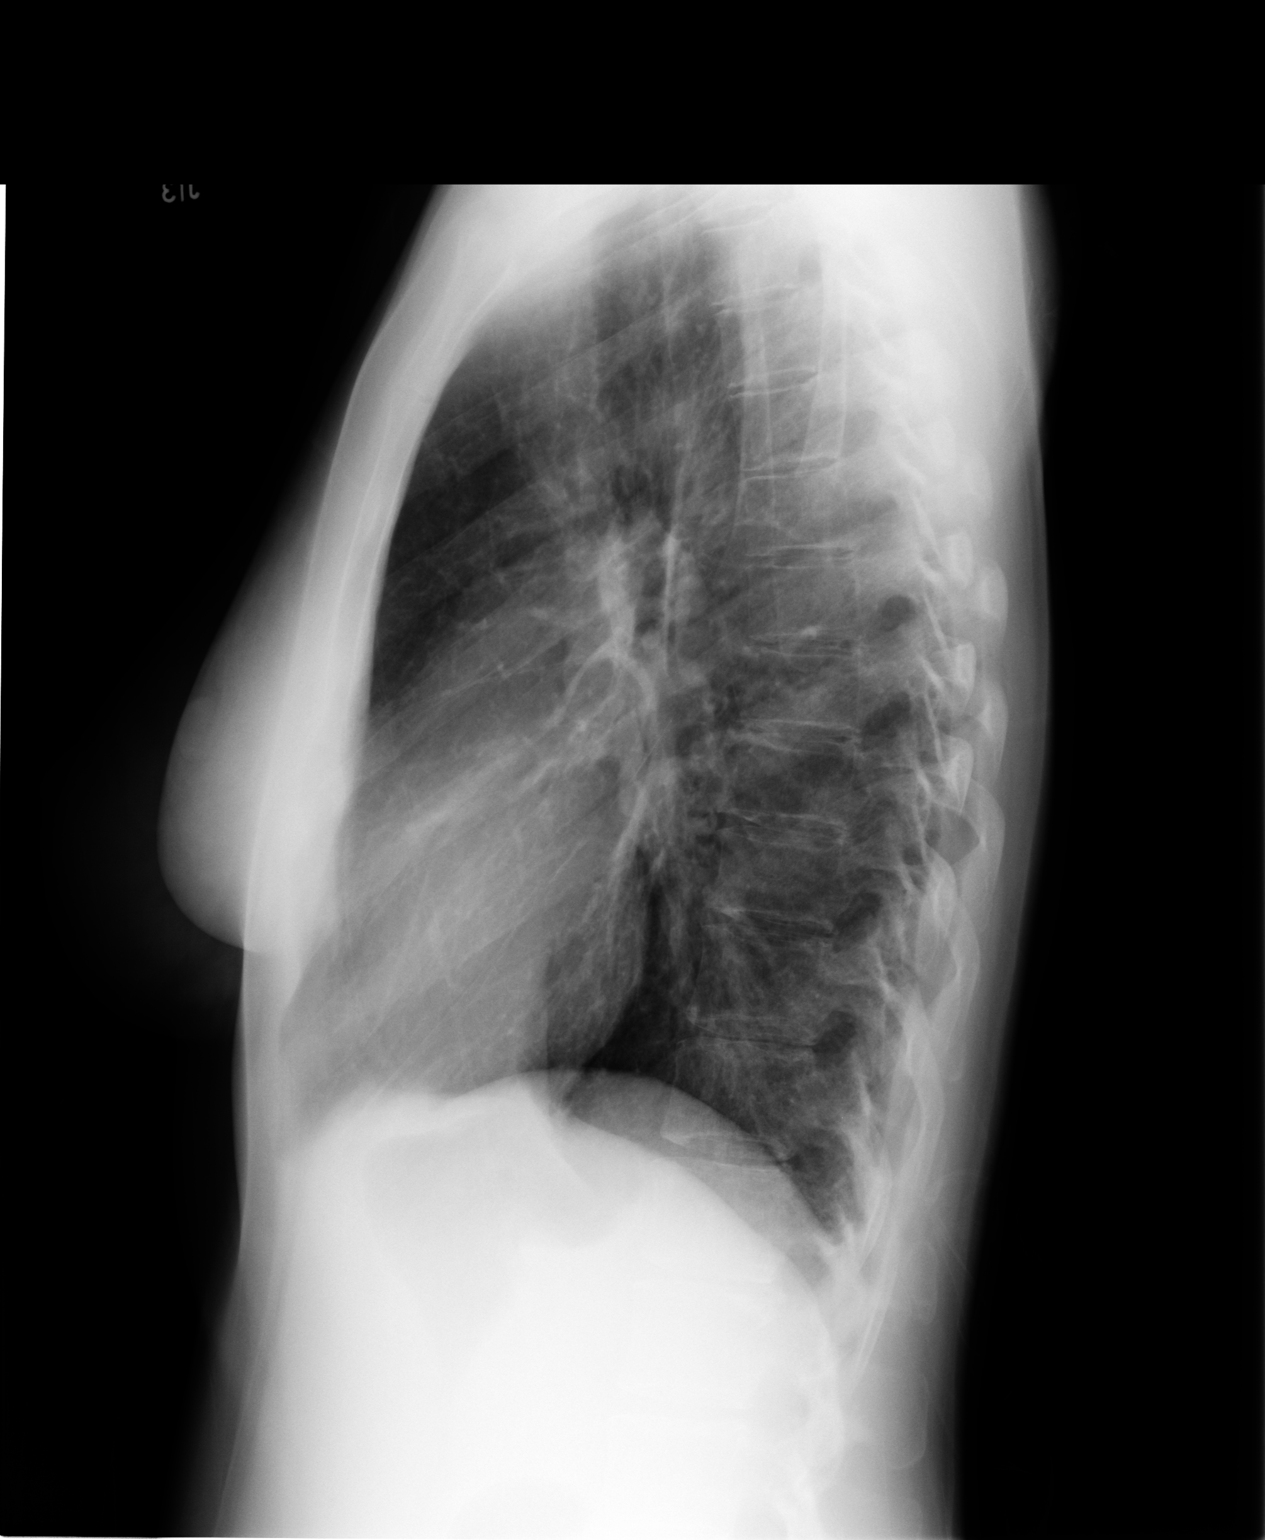

[2 of 2 positions shown; findings below may reference images not displayed]

IMPRESSION: No acute cardiopulmonary disease. Resolution of the
previous infiltrate.

## 2013-05-13 ENCOUNTER — Other Ambulatory Visit: Payer: Self-pay

## 2013-07-29 ENCOUNTER — Ambulatory Visit (INDEPENDENT_AMBULATORY_CARE_PROVIDER_SITE_OTHER): Payer: BC Managed Care – PPO | Admitting: Adult Health

## 2013-07-29 ENCOUNTER — Inpatient Hospital Stay: Payer: Self-pay | Admitting: Internal Medicine

## 2013-07-29 ENCOUNTER — Encounter: Payer: Self-pay | Admitting: Adult Health

## 2013-07-29 VITALS — BP 110/60 | HR 102 | Temp 99.2°F | Resp 18 | Wt 128.5 lb

## 2013-07-29 DIAGNOSIS — R0609 Other forms of dyspnea: Secondary | ICD-10-CM

## 2013-07-29 DIAGNOSIS — R0989 Other specified symptoms and signs involving the circulatory and respiratory systems: Secondary | ICD-10-CM

## 2013-07-29 DIAGNOSIS — R0603 Acute respiratory distress: Secondary | ICD-10-CM

## 2013-07-29 LAB — MAGNESIUM: MAGNESIUM: 1.6 mg/dL — AB

## 2013-07-29 LAB — TROPONIN I
Troponin-I: <0.02 ng/mL
Troponin-I: <0.02 ng/mL

## 2013-07-29 LAB — CBC WITH DIFFERENTIAL/PLATELET
Basophil #: 0 10*3/uL (ref 0.0–0.1)
Basophil %: 0.3 %
EOS PCT: 0.1 %
Eosinophil #: 0 10*3/uL (ref 0.0–0.7)
HCT: 30.2 % — ABNORMAL LOW (ref 35.0–47.0)
HGB: 9.6 g/dL — ABNORMAL LOW (ref 12.0–16.0)
Lymphocyte #: 0.4 10*3/uL — ABNORMAL LOW (ref 1.0–3.6)
Lymphocyte %: 7.5 %
MCH: 26.2 pg (ref 26.0–34.0)
MCHC: 31.8 g/dL — AB (ref 32.0–36.0)
MCV: 82 fL (ref 80–100)
Monocyte #: 0.3 x10 3/mm (ref 0.2–0.9)
Monocyte %: 6.3 %
Neutrophil #: 4.1 10*3/uL (ref 1.4–6.5)
Neutrophil %: 85.8 %
PLATELETS: 125 10*3/uL — AB (ref 150–440)
RBC: 3.67 10*6/uL — ABNORMAL LOW (ref 3.80–5.20)
RDW: 17.3 % — AB (ref 11.5–14.5)
WBC: 4.8 10*3/uL (ref 3.6–11.0)

## 2013-07-29 LAB — COMPREHENSIVE METABOLIC PANEL
ALBUMIN: 3.1 g/dL — AB (ref 3.4–5.0)
ALK PHOS: 37 U/L — AB
ALT: 14 U/L (ref 12–78)
AST: 14 U/L — AB (ref 15–37)
Anion Gap: 7 (ref 7–16)
BILIRUBIN TOTAL: 0.2 mg/dL (ref 0.2–1.0)
BUN: 9 mg/dL (ref 7–18)
CALCIUM: 7.7 mg/dL — AB (ref 8.5–10.1)
Chloride: 105 mmol/L (ref 98–107)
Co2: 25 mmol/L (ref 21–32)
Creatinine: 1 mg/dL (ref 0.60–1.30)
EGFR (African American): >60
GLUCOSE: 141 mg/dL — AB (ref 65–99)
Osmolality: 275 (ref 275–301)
Potassium: 2.6 mmol/L — ABNORMAL LOW (ref 3.5–5.1)
Sodium: 137 mmol/L (ref 136–145)
TOTAL PROTEIN: 6 g/dL — AB (ref 6.4–8.2)

## 2013-07-29 LAB — CK-MB
CK-MB: <0.5 ng/mL — ABNORMAL LOW (ref 0.5–3.6)
CK-MB: <0.5 ng/mL — ABNORMAL LOW (ref 0.5–3.6)

## 2013-07-29 LAB — RAPID INFLUENZA A&B ANTIGENS (ARMC ONLY)

## 2013-07-29 NOTE — Assessment & Plan Note (Signed)
Significant wheezing. Nebulizer treatment in clinic with some improvement. Still short of breath. Already on prednisone. HX of CVID. Sending her to the ED given her presentation, acute illness and immune compromise. Son is with her and he will drive her to the ED. Kriste BasqueBecky, RN called the ED to advise of patient being sent along with respiratory presentation.

## 2013-07-29 NOTE — Progress Notes (Signed)
   Subjective:    Patient ID: Ashley [name], female    DOB: Jan 28, 1971, 43 y.o.   MRN: [id number]  HPI  Pt is a 43 y/o previous pt of Dr. Darrick Huntsmanullo who is now living in [place]. She arrived 2 days ago to visit and feels she picked something up while on the plane. She has wheezing, chest tightness, coughing. She looks acutely ill. Hx CVID receiving treatment with weekly IVIg. She is short of breath and appears to be struggling. Wheezing is audible without a stethoscope. Poor air movement heard left posterior lung fields. Sats were adequate and did not match her presentation of how sick she looks.  Review of Systems  Constitutional: Positive for chills. Negative for fever.  Respiratory: Positive for cough, chest tightness, shortness of breath and wheezing.   Cardiovascular: Negative.        Objective:   Physical Exam  Constitutional: She is oriented to person, place, and time. She appears distressed.  Respiratory distress  Cardiovascular: Normal rate, regular rhythm and normal heart sounds.   Pulmonary/Chest: She is in respiratory distress. She has wheezes. She has no rales.  Neurological: She is alert and oriented to person, place, and time.  Skin: Skin is warm and dry.  Psychiatric: She has a normal mood and affect. Her behavior is normal. Judgment and thought content normal.          Assessment & Plan:

## 2013-07-29 NOTE — Progress Notes (Signed)
Pre visit review using our clinic review tool, if applicable. No additional management support is needed unless otherwise documented below in the visit note. 

## 2013-07-30 DIAGNOSIS — R079 Chest pain, unspecified: Secondary | ICD-10-CM

## 2013-07-30 DIAGNOSIS — R0602 Shortness of breath: Secondary | ICD-10-CM

## 2013-07-30 LAB — BASIC METABOLIC PANEL
Anion Gap: 5 — ABNORMAL LOW (ref 7–16)
BUN: 6 mg/dL — AB (ref 7–18)
CALCIUM: 6.7 mg/dL — AB (ref 8.5–10.1)
Chloride: 112 mmol/L — ABNORMAL HIGH (ref 98–107)
Co2: 23 mmol/L (ref 21–32)
Creatinine: 0.68 mg/dL (ref 0.60–1.30)
Glucose: 160 mg/dL — ABNORMAL HIGH (ref 65–99)
OSMOLALITY: 280 (ref 275–301)
POTASSIUM: 4.3 mmol/L (ref 3.5–5.1)
Sodium: 140 mmol/L (ref 136–145)

## 2013-07-30 LAB — CBC WITH DIFFERENTIAL/PLATELET
BASOS PCT: 0 %
Basophil #: 0 10*3/uL (ref 0.0–0.1)
EOS PCT: 0 %
Eosinophil #: 0 10*3/uL (ref 0.0–0.7)
HCT: 27.2 % — ABNORMAL LOW (ref 35.0–47.0)
HGB: 8.8 g/dL — ABNORMAL LOW (ref 12.0–16.0)
Lymphocyte #: 0.2 10*3/uL — ABNORMAL LOW (ref 1.0–3.6)
Lymphocyte %: 5.6 %
MCH: 26.3 pg (ref 26.0–34.0)
MCHC: 32.2 g/dL (ref 32.0–36.0)
MCV: 82 fL (ref 80–100)
Monocyte #: 0.1 x10 3/mm — ABNORMAL LOW (ref 0.2–0.9)
Monocyte %: 2.3 %
NEUTROS PCT: 92.1 %
Neutrophil #: 3.1 10*3/uL (ref 1.4–6.5)
Platelet: 107 10*3/uL — ABNORMAL LOW (ref 150–440)
RBC: 3.34 10*6/uL — ABNORMAL LOW (ref 3.80–5.20)
RDW: 17.5 % — ABNORMAL HIGH (ref 11.5–14.5)
WBC: 3.3 10*3/uL — ABNORMAL LOW (ref 3.6–11.0)

## 2013-07-30 LAB — FERRITIN: FERRITIN (ARMC): 12 ng/mL (ref 8–388)

## 2013-07-30 LAB — IRON AND TIBC
IRON BIND. CAP.(TOTAL): 247 ug/dL — AB (ref 250–450)
IRON: 13 ug/dL — AB (ref 50–170)
Iron Saturation: 5 %
Unbound Iron-Bind.Cap.: 234 ug/dL

## 2013-07-30 LAB — LIPID PANEL
CHOLESTEROL: 125 mg/dL (ref 0–200)
HDL Cholesterol: 54 mg/dL (ref 40–60)
LDL CHOLESTEROL, CALC: 62 mg/dL (ref 0–100)
Triglycerides: 47 mg/dL (ref 0–200)
VLDL CHOLESTEROL, CALC: 9 mg/dL (ref 5–40)

## 2013-07-30 LAB — MAGNESIUM: MAGNESIUM: 2.5 mg/dL — AB

## 2013-07-30 LAB — OCCULT BLOOD X 1 CARD TO LAB, STOOL: OCCULT BLOOD, FECES: NEGATIVE

## 2013-07-30 LAB — TSH: Thyroid Stimulating Horm: 0.66 u[IU]/mL

## 2013-07-31 LAB — CBC WITH DIFFERENTIAL/PLATELET
BASOS PCT: 0.5 %
Basophil #: 0.1 10*3/uL (ref 0.0–0.1)
EOS PCT: 0 %
Eosinophil #: 0 10*3/uL (ref 0.0–0.7)
HCT: 29.5 % — ABNORMAL LOW (ref 35.0–47.0)
HGB: 9.6 g/dL — ABNORMAL LOW (ref 12.0–16.0)
LYMPHS PCT: 3 %
Lymphocyte #: 0.4 10*3/uL — ABNORMAL LOW (ref 1.0–3.6)
MCH: 26.7 pg (ref 26.0–34.0)
MCHC: 32.7 g/dL (ref 32.0–36.0)
MCV: 82 fL (ref 80–100)
MONOS PCT: 2.7 %
Monocyte #: 0.3 x10 3/mm (ref 0.2–0.9)
NEUTROS PCT: 93.8 %
Neutrophil #: 11.9 10*3/uL — ABNORMAL HIGH (ref 1.4–6.5)
Platelet: 154 10*3/uL (ref 150–440)
RBC: 3.61 10*6/uL — AB (ref 3.80–5.20)
RDW: 18.2 % — AB (ref 11.5–14.5)
WBC: 12.7 10*3/uL — AB (ref 3.6–11.0)

## 2013-07-31 LAB — BASIC METABOLIC PANEL
Anion Gap: 5 — ABNORMAL LOW (ref 7–16)
BUN: 13 mg/dL (ref 7–18)
Calcium, Total: 7.8 mg/dL — ABNORMAL LOW (ref 8.5–10.1)
Chloride: 110 mmol/L — ABNORMAL HIGH (ref 98–107)
Co2: 23 mmol/L (ref 21–32)
Creatinine: 0.72 mg/dL (ref 0.60–1.30)
GLUCOSE: 123 mg/dL — AB (ref 65–99)
OSMOLALITY: 277 (ref 275–301)
POTASSIUM: 4.2 mmol/L (ref 3.5–5.1)
Sodium: 138 mmol/L (ref 136–145)

## 2013-08-03 LAB — CULTURE, BLOOD (SINGLE)

## 2014-01-15 LAB — PSA: PSA: NORMAL

## 2014-01-15 LAB — HM COLONOSCOPY: HM Colonoscopy: NORMAL

## 2014-07-18 ENCOUNTER — Ambulatory Visit: Payer: BC Managed Care – PPO | Admitting: Internal Medicine

## 2014-10-29 NOTE — Discharge Summary (Signed)
PATIENT NAME:  Ashley BossierRAY [name], Ashley [name] MR#:  [id number] DATE OF BIRTH:  11-09-1970  DATE OF ADMISSION:  07/29/2013 DATE OF DISCHARGE:  08/01/2013  PRESENTING COMPLAINT: Shortness of breath.   PRIMARY CARE PHYSICIAN: Local is Dr. Darrick Huntsmanullo. The patient lives currently in [place].   DISCHARGE DIAGNOSES:  1.  Acute asthma exacerbation.  2.  Suspected right lower lobe early infiltrate/pneumonia, influenza [name]. 3.  Acute bronchitis/respiratory congestion.  4.  History of CVIG.  5.  Hypothyroidism.   Sats on room air 92%.   CODE STATUS: Full code.   DISCHARGE MEDICATIONS:   1.  Symbicort 80/4.5, 2 puffs b.i.d.  2.  Synthroid 200 mcg p.o. daily.  3.  Gamunex-C injectable per week as before.  4.  Prednisone taper.  5.  Acetaminophen 325 mg 2 tablets every 4 hours as needed.  6.  Tamiflu 75 mg b.i.d., complete the course.  7.  Albuterol oral inhaler 2 puffs every 6 hours as needed.  8.  Azithromycin 250 p.o. daily.  9.  Calcium with vitamin D 1 tablet b.i.d.   DIET: Regular.   FOLLOWUP: With your PCP in [place].   BRIEF SUMMARY OF HOSPITAL COURSE: [name] is [name] with past medical history of CVIG, who comes into the Emergency Room with:  1.  Asthma exacerbation in the setting of positive influenza [name]. The patient was started on IV steroids high-dose along with pulmonary toilet with nebulizers and oral inhalers. She was also kept on droplet precautions. Azithromycin was started empirically given her symptoms. She was resumed back on her Symbicort and the patient was advised to use albuterol for rescue inhaler. She gradually improved better. Her sats remained 92% on room air. Initial x-Ashley did not show any evidence of pneumonia. Chest x-Ashley repeat, AP view, did not show any evidence of pneumonia. Lateral views showed possible early right lower lobe atelectasis versus infiltrate. The patient has since improved. She will be discharged with diagnosis of suspected acute bronchitis  with upper respiratory congestion.  2.  CVIG. Repeatedly low IgA and IgG levels and recurrent infections. The patient has her immunologist at [facility]. She will continue her IVIG weekly.  3.  Hypothyroidism. Synthroid was continued.  4.  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  5.  Anemia of chronic disease, remains stable. The patient's hemoglobin remained stable. She did receive [name] couple doses of IV ferrilect  Hospital stay otherwise remained stable.   CODE STATUS: She remained [name] full code.   TIME SPENT: 40 minutes.   ____________________________ Wylie HailSona [name]. Allena KatzPatel, MD sap:aw D: 08/02/2013 07:02:36 ET T: 08/02/2013 07:28:45 ET JOB#: [id number]  cc: [name]. Allena KatzPatel, MD, <Dictator> Willow OraSONA [name] Elion Hocker MD ELECTRONICALLY SIGNED 08/02/2013 15:07

## 2014-10-29 NOTE — H&P (Signed)
PATIENT NAME:  Ashley [name], Ashley [name] MR#:  [id number] DATE OF BIRTH:  04/20/1971  DATE OF ADMISSION:  07/29/2013  PRIMARY CARE PHYSICIAN: Dr. Derrel Nip.   REFERRING PHYSICIAN: Dr. Jasmine December.   CHIEF COMPLAINT: Shortness of breath.   HISTORY OF PRESENT ILLNESS: The patient is a pleasant 44 year old female with a history of hypothyroidism, pneumonia, common variable immunodeficiency, and sarcoid. She has been started on weekly immunoglobulins for the past couple of years.   Of note, she has had some eczema with super infections on the skin several times since Christmas and has been on steroids several times.   Of note, the patient is on steroids now with clindamycin. The patient had been coming from [place] on Tuesday. There were apparently some sick passengers coughing and upper respiratory symptoms. She started to have shortness of breath, cough, wheezing, with a productive cough with yellow sputum since then. She was referred to come here by the PCP.   She has no fevers but has been having chills. Here, she was found to have no pneumonia on x-ray of the chest.  EKG shows significant ST depressions. The patient states that she has been having some chest tightness in mid sternal area as well without radiation. She has been given Decadron, some nebs. Hospitalist services were contacted for further evaluation and management.   PAST MEDICAL HISTORY: Hypothyroidism, sarcoid, history of MRSA pneumonia in the past, history of Haemophilus influenza in the past, asthma, common variable immunodeficiency followed through [facility] on weekly immunoglobulins, celiac.   OUTPATIENT MEDICATIONS: Synthroid 200 mcg daily, Symbicort 160/4.5 mcg b.i.d. She is currently on prednisone taper 60 mg today and clindamycin, unknown dose, with immunoglobulin injections q. weekly on Mondays.   ALLERGIES: NOROXIN, PENICILLIN, SEREVENT, SULFA. THE PATIENT HAS TAKEN KEFLEX MULTIPLE TIMES IN THE PAST, BUT THE LAST TIME SHE WAS ON, SHE HAS SOME  GI UPSET, NAUSEA, VOMITING, AND IT HAS BEEN ADDED TO HER ALLERGY LIST.   FAMILY HISTORY: Both parents: Hypertension, diabetes and congestive heart failure. Mom also had primary immunodeficiency.   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   PAST SURGICAL HISTORY: Thyroid and parathyroid surgery.   REVIEW OF SYSTEMS:  CONSTITUTIONAL: No fever, fatigue, weight changes.  EYES: No blurry vision or double vision.  ENT: No tinnitus or hearing loss. No snoring or postnasal drip.  RESPIRATORY: Positive for productive cough, wheezing, shortness of breath, dyspnea on exertion and painful respirations on the right.  CARDIOVASCULAR: Some chest pressure with deep respirations. No chest pain. No radiation of this pressure. No swelling in the legs. Has had palpitations. The patient states that blood pressure usually is on the lower side, around 90.  GASTROINTESTINAL: No nausea, vomiting, diarrhea, abdominal pain, bloody stools or dark stools.  GENITOURINARY: Denies dysuria or hematuria.  HEMOLYMPH: No anemia or easy bruising.  SKIN: Sarcoid with eczema.  MUSCULOSKELETAL: Denies arthritis or gout.  NEUROLOGIC: Denies focal weakness or numbness.  PSYCHIATRIC: Denies anxiety or insomnia.   PHYSICAL EXAMINATION:  VITAL SIGNS: Temperature on arrival 98.9, pulse rate 109. While I was in the room, pulse was 119. Blood pressure was 73/32. Oxygen saturation 100% on room air on arrival.  GENERAL: A well-developed female sitting in bed in no obvious distress, talking in full sentences.  HEENT: Normocephalic, atraumatic. Pupils are equal and reactive. Anicteric sclerae. Extraocular muscles intact. Moist mucous membranes.  NECK: Supple. No thyroid tenderness. No cervical lymphadenopathy.  CARDIOVASCULAR: S1, S2, tachycardic. No murmurs appreciated.  LUNGS: Bilateral diffuse wheezing. No crackles  or rales.  ABDOMEN: Soft, nontender, nondistended. Positive bowel sounds in all quadrants.   EXTREMITIES: No pitting edema.  NEUROLOGIC: Cranial nerves II through XII grossly intact. Strength is 5 out of 5 in all extremities. Sensation is intact to light touch.  PSYCHIATRIC: Awake, alert, oriented x3.  SKIN: Some discoid circular lesions in the upper extremities without any oozing, redness or drainage. Otherwise do not see any significant cellulitis.   LABORATORY AND IMAGING: Glucose 141, BUN 9, creatinine 1, sodium 137, potassium 2.6, magnesium 1.6. LFTs: Alk phos 37, AST 14, ALT 14, albumin 3.1, bilirubin 0.2. Troponin negative. White count of 4.8, hemoglobin 9.6. Platelets are 125.   EKG shows sinus tach, rate of 124 with deep ST depressions on V3, V4, V5, V6 as well as 2, 3 and aVF. No acute ST elevations.   X-ray of the chest negative.   ASSESSMENT AND PLAN: We have a 44 year old female, with history of common variable immunodeficiency on weekly immunoglobulin injections, sarcoid, asthma, hypothyroidism, who is currently on steroids, who comes in with shortness of breath and wheezing.   The patient also has significant electrolyte abnormalities with severe hypokalemia, hypomagnesemia, and EKG changes.   Of note, she has no active chest pain now, but she has some chest tightness with ongoing wheezing and shortness of breath. She is tachycardic and when I was in the room, she was hypotensive as well but mentating fine. She states that usually her blood pressure systolic is around 90 but now it is somewhat lower. I would give the patient 2-liter boluses and run them wide open. She has been on steroids. She has been on steroids in the past. The possibility of adrenal insufficiency is there but I suspect that that is less likely. I would start her on IV steroids which should help with adrenal insufficiency as well as her acute asthma flare, which I suspect she has. I would start her on nebulizers around-the-clock, Tussionex and azithromycin at this point.   She does not appear to be  hypoxic.   In regard to her EKG changes, they are in multiple leads. It is possible these are from electrolyte abnormalities. Also possible that she is having a non-ST-elevation myocardial infarction, but I think that that is less likely. I would start her on aspirin at this point, obtain an echocardiogram, cycle the troponins and the CK-MB, obtain a cardiology consult, see what the echocardiogram shows and repeat another EKG tomorrow. An aspirin has been given. We have repleted p.o. and IV potassium while the patient has been here with 40 of p.o. and 40 of IV, and I will go ahead and recheck another BMP later today.   I will start her on deep vein prophylaxis with heparin. Obtain a sputum culture.  Check a TSH. Continue her Synthroid as well and place her on standing normal saline now.   The patient is hypotensive and at a significant risk of further decompensation. Would monitor blood pressure carefully, but at this point, she is mentating fine and see how she does with  normal saline boluses.   TOTAL CRITICAL CARE TIME SPENT: 50 minutes.   ____________________________ Vivien Presto, MD sa:np D: 07/29/2013 16:19:00 ET T: 07/29/2013 17:27:02 ET JOB#: [id number]  cc: Deborra Medina, MD Vivien Presto, MD, <Dictator>   Vivien Presto MD ELECTRONICALLY SIGNED 08/21/2013 10:52

## 2014-10-29 NOTE — Consult Note (Signed)
General Aspect [name] is a 44yo Caucasian female w/ no prior cardiac history, PMHx s/f pulmonary sarcoidosis, variable immunodeficiency syndrome, chronic asthma, hypothyroidism and family history of premature CAD.   She lives here and in [place] for half the year. She is from there originally. She follows w/ pulmonary/rheumatology for pulmonary sarcoidosis. No involvement to other organs/organ systems, although a comprehensive eval has not been done in some time. She receives weekly immunoglobulins for immune deficiency. She has a history of multiple pneumonias and skin infections requiring steroids. She has a significant family history of CAD- both parents diagnosed with CHF in 57s, ischemic, mother with massive MI in early 72s. Brother w/ CABG x 4 in early 28s. Sister w/ cardiomyopathy. She has no prior diagnosis of heart failure. She had volume overload in 2011 during her last pregnancy. Echo 09/2009: EF >55%, mild MR/TR, no pericardial effusion. No prior ischemic evaluation.   She was returning from [place]. There were several passengers on her flight with cough. Shortly after returning, she developed shortness of breath, cough productive of yellow sputum shortly after. She was advised to present to the ED by her PCP Dr. Darrick Huntsman. She does endorse intermittent, exertional chest pressure since October. She attributed this to her asthma/pulmonary sarcoid. These episodes would abate after a few minutes of rest. She does have sharp substernal chest pains occasionally which radiate to her neck and shoulders bilaterally at rest. These are more fleeting. No aggravation on laying flat, cough, inspiration. She denies PND, orthopnea, LE edema, weight increase, palpitations, lightheadedness or syncope.   Present Illness In the ED, EKG revealed sinus tachycardia, 124 bpm, downsloping ST depressions and TWIs V3-V6, II, III, aVF. Incomplete RBBB, LAE. She was hypotensive (SBP 90s, "tends to run low"). Mg 1.6, K 2.6, Ca 7.7  as well. Initial TnI WNL. CXR- normal cardio/mediastinal silhouette, no pleural effusion, no acute abnormality. CBC- no leukocytosis, Hgb 9.6/Hct 30.2, PLT 125K. Blood cx x 2 NGTD. Influenza A POSITIVE. She was admitted by the medicine service. Started on steriods, tamiflu, nebs. Not hypoxic.   Two subsequent troponins WNL overnight. TSH WNL. Telemetry review- no arrhythmias, sinus brady HR 40s overnight. Asymptomatic.   Repeat EKG this AM shows isoelectric STs, upright TWs (some blunting). Mg 2.5 and K 4.3. BP 95-102/40-52.   OUTPATIENT MEDICATIONS: Synthroid 200 mcg daily, Symbicort 160/4.5 mcg b.i.d. She is currently on prednisone taper 60 mg today and clindamycin, unknown dose, with immunoglobulin injections q. weekly on Mondays.   ALLERGIES: NOROXIN, PENICILLIN, SEREVENT, SULFA. THE PATIENT HAS TAKEN KEFLEX MULTIPLE TIMES IN THE PAST, BUT THE LAST TIME SHE WAS ON, SHE HAS SOME GI UPSET, NAUSEA, VOMITING, AND IT HAS BEEN ADDED TO HER ALLERGY LIST.   FAMILY HISTORY: Both parents: Hypertension, diabetes and congestive heart failure. Mom also had primary immunodeficiency.   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   PAST SURGICAL HISTORY: Thyroid and parathyroid surgery.   Physical Exam:  GEN no acute distress, thin   HEENT pink conjunctivae, PERRL, hearing intact to voice   NECK supple  No masses  trachea midline  no JVD or bruits   RESP normal resp effort  no use of accessory muscles  scattered exp wheezing, rhonchi, no appreciable rales   CARD Regular rate and rhythm  Normal, S1, S2  No murmur  no rubs, gallops   ABD denies tenderness  normal BS   EXTR negative cyanosis/clubbing, negative edema   SKIN normal to palpation, No rashes   NEURO  follows commands, motor/sensory function intact   PSYCH alert, A+O to time, place, person   Review of Systems:  Subjective/Chief Complaint cough, sputum production, SOB, chest pain   Respiratory: Frequent  cough  Short of breath  Wheezing  Sputum   Cardiovascular: Chest pain or discomfort  Tightness   Home Medications: Medication Instructions Status  Symbicort 80 mcg-4.5 mcg/inh inhalation aerosol 2 puff(s) inhaled 2 times a day Active  Synthroid 200 mcg (0.2 mg) oral tablet 1 tab(s) orally once a day Active  Gamunex-C  injectable  once per week Active   Lab Results:  Thyroid:  23-Jan-15 04:16   Thyroid Stimulating Hormone 0.66 (0.45-4.50 (International Unit)  ----------------------- Pregnant patients have  different reference  ranges for TSH:  - - - - - - - - - -  Pregnant, first trimetser:  0.36 - 2.50 uIU/mL)  Routine Micro:  22-Jan-15 16:55   Micro Text Report INFLUENZA A+B ANTIGENS   COMMENT                   POSITIVE FOR INFLUENZA A (ANTIGEN PRESENT)   COMMENT                   NEGATIVE FOR INFLUENZA B (ANTIGEN ABSENT)   ANTIBIOTIC                       Comment 1.. POSITIVE FOR INFLUENZA A (ANTIGEN PRESENT) A negative result does not exclude influenza. Correlation with clinical impression is required.  Comment 2.. NEGATIVE FOR INFLUENZA B (ANTIGEN ABSENT)  Result(s) reported on 29 Jul 2013 at 05:35PM.  Routine Chem:  22-Jan-15 12:12   Potassium, Serum  2.6  Calcium (Total), Serum  7.7  Magnesium, Serum  1.6 (1.8-2.4 THERAPEUTIC RANGE: 4-7 mg/dL TOXIC: > 10 mg/dL  -----------------------)  23-Jan-15 04:16   Potassium, Serum 4.3  Calcium (Total), Serum  6.7  Magnesium, Serum  2.5 (1.8-2.4 THERAPEUTIC RANGE: 4-7 mg/dL TOXIC: > 10 mg/dL  -----------------------)  Cardiac:  22-Jan-15 12:12   CPK-MB, Serum  < 0.5 (Result(s) reported on 29 Jul 2013 at 06:16PM.)  Troponin I < 0.02 (0.00-0.05 0.05 ng/mL or less: NEGATIVE  Repeat testing in 3-6 hrs  if clinically indicated. >0.05 ng/mL: POTENTIAL  MYOCARDIAL INJURY. Repeat  testing in 3-6 hrs if  clinically indicated. NOTE: An increase or decrease  of 30% or more on serial  testing suggests a  clinically  important change)    17:23   CPK-MB, Serum  < 0.5 (Result(s) reported on 29 Jul 2013 at 06:09PM.)  Troponin I < 0.02 (0.00-0.05 0.05 ng/mL or less: NEGATIVE  Repeat testing in 3-6 hrs  if clinically indicated. >0.05 ng/mL: POTENTIAL  MYOCARDIAL INJURY. Repeat  testing in 3-6 hrs if  clinically indicated. NOTE: An increase or decrease  of 30% or more on serial  testing suggests a  clinically important change)    21:21   CPK-MB, Serum  < 0.5 (Result(s) reported on 29 Jul 2013 at 09:47PM.)  Troponin I < 0.02 (0.00-0.05 0.05 ng/mL or less: NEGATIVE  Repeat testing in 3-6 hrs  if clinically indicated. >0.05 ng/mL: POTENTIAL  MYOCARDIAL INJURY. Repeat  testing in 3-6 hrs if  clinically indicated. NOTE: An increase or decrease  of 30% or more on serial  testing suggests a  clinically important change)  Routine Hem:  22-Jan-15 12:12   Hemoglobin (CBC)  9.6  Hematocrit (CBC)  30.2  Platelet Count (CBC)  125  23-Jan-15 04:16  Hemoglobin (CBC)  8.8  Hematocrit (CBC)  27.2  Platelet Count (CBC)  107   EKG:  Interpretation described above   Radiology Results: XRay:    22-Jan-15 12:19, Chest PA and Lateral  Chest PA and Lateral   REASON FOR EXAM:    cough  COMMENTS:       PROCEDURE: DXR - DXR CHEST PA (OR AP) AND LATERAL  - Jul 29 2013 12:19PM     CLINICAL DATA:  Cough, asthma, chest pain    EXAM:  CHEST  2 VIEW    COMPARISON:  10/22/2011    FINDINGS:  Cardiomediastinal silhouette is stable. No acute infiltrate or  pleural effusion. No pulmonary edema. Bony thorax is unremarkable.   IMPRESSION:  No active cardiopulmonary disease.      Electronically Signed    By: Natasha Mead M.D.    On: 07/29/2013 12:25         Verified By: Kennieth Francois, M.D.,    Penicillin: Hives  Sulfa drugs: Hives  Noroxin: Hives  Serevent: Hives  Hizentra: Hives  Keflex: Unknown  Vital Signs/Nurse's Notes: **Vital Signs.:   23-Jan-15 04:27  Vital Signs Type Routine   Temperature Temperature (F) 98.1  Celsius 36.7  Temperature Source oral  Pulse Pulse 76  Respirations Respirations 16  Systolic BP Systolic BP 95  Diastolic BP (mmHg) Diastolic BP (mmHg) 58  Mean BP 70  Pulse Ox % Pulse Ox % 95  Pulse Ox Activity Level  At rest  Oxygen Delivery Room Air/ 21 %    Impression 44yo Caucasian female w/ no prior cardiac history, PMHx s/f pulmonary sarcoidosis, variable immunodeficiency syndrome, chronic asthma, hypothyroidism and family history of premature CAD.   1. Influenza A On a background of pulmonary sarcoid, chronic asthma, immunodeficiency syndrome. There may be a superimposed bacterial, pulmonary infection. She is covered with Tamiflu and Azithromycin. Receiving nebs. Coarse lung sounds on exam. No evidence of rales or pulmonary edema on CXR.  -- Management per primary team  2. Chest pain  She reports intermittent sharp, fleeting chest pain since October. This radiates to the shoulders and neck bilaterally. She does report experiencing exertional chest pressure when walking upstairs, relieved by rest. She thinks this was attributed to asthma, pulmonary sarcoidosis. Objectively, TnIs x 3 have returned WNL. EKG changes in the ED yesterday (ST dep, TWIs) have normalized with Mg, K replenishment. No low voltage QRS, ST elevations, PR depressions. There is incomplete RBBB and LAE. No cardiomegaly on CXR. She denies arrhythmic or CHF type symptoms. Not fluid overloaded on exam. She does have an extensive family h/o premature CAD.  -- Agree with 2D echo to r/o cardiac sarcoidosis. Aide in guiding ischemic eval. Assess EF, wall motion, structural changes, etc.  -- Symptoms seem to be primarily from pulmonary infection. No acute cardiac process suspected at this time. Ruled out, EKG normalized. Given exertional chest discomfort and family history, could consider outpatient stress testing after infection resolved. -- Continue low-dose ASA, hold BB (hypotension,  bradycardic overnight) -- Risk stratify with lipid panel, Hgb A1C  3. Electrolyte abnormalities Hypo-/kalemia/mangesemia/calcemia. The latter two repleted overnight. Remains hypocalcemic this AM. Down to 6.6. H/o parathyroid surgery.  -- Further eval/management per primary   Plan 4. Normocytic anemia No abnormal bleeding. -- Continue to monitor  5. Thrombocytoepnia  Stable. No abnormal bleeding.  -- Continue to monitor   Electronic Signatures for Addendum Section:  Lorine Bears (MD) (Signed Addendum 23-Jan-15 17:41)  The patient was seen and examined.  Agree with the above. She has known pulmonary sarcoidosis and was admitted due to the flu. She had chest tightness over last few days. She had recent episodes of chest pain radiating to neck even before she got this with current illness. TnI is negative. Echo was normal. Current sympotms are likely related to pulmoanry issues. however, given previous symptoms and risk factors for CAD, recommend an outpatient stress test once she recovers from current illness.   Electronic Signatures: Gery Pray (PA-C)  (Signed 23-Jan-15 09:43)  Authored: General Aspect/Present Illness, History and Physical Exam, Review of System, Home Medications, Labs, EKG , Radiology, Allergies, Vital Signs/Nurse's Notes, Impression/Plan Lorine Bears (MD)  (Signed 23-Jan-15 17:41)  Co-Signer: General Aspect/Present Illness, History and Physical Exam, Review of System, Home Medications, Labs, EKG , Radiology, Allergies, Vital Signs/Nurse's Notes, Impression/Plan   Last Updated: 23-Jan-15 17:41 by Lorine Bears (MD)

## 2015-01-03 ENCOUNTER — Ambulatory Visit (INDEPENDENT_AMBULATORY_CARE_PROVIDER_SITE_OTHER): Payer: BC Managed Care – PPO | Admitting: Nurse Practitioner

## 2015-01-03 VITALS — BP 102/60 | HR 82 | Temp 98.4°F | Resp 16 | Ht 69.0 in

## 2015-01-03 DIAGNOSIS — J40 Bronchitis, not specified as acute or chronic: Secondary | ICD-10-CM

## 2015-01-03 MED ORDER — MOXIFLOXACIN HCL 400 MG PO TABS: 400.0000 mg | ORAL_TABLET | Freq: Every day | ORAL | Status: DC

## 2015-01-03 NOTE — Progress Notes (Signed)
Pre visit review using our clinic review tool, if applicable. No additional management support is needed unless otherwise documented below in the visit note. 

## 2015-01-03 NOTE — Patient Instructions (Signed)
Continue on prednisone and start the Avelox (daily for 7 days).  Follow up with me if not helpful in 2-3 days.

## 2015-01-03 NOTE — Progress Notes (Signed)
   Subjective:    Patient ID: Ashley [name], female    DOB: 1970-09-23, 44 y.o.   MRN: [id number]  HPI  [name] is a 44 yo female with a CC of URI x 1 month.    1) Since beginning of June  On prednisone and couple rounds of chest congestion and cough [place]- Z-pack, Levaquin- helped for awhile.   Prednisone- last Tuesday started back (only off for 2-3 days)   40 mg currently of prednisone Xopenex- helpful somewhat   Encouraged probiotics! Has taken align in past  Immunologist suggests Avelox   Review of Systems  Constitutional: Negative for fever, chills, diaphoresis and fatigue.  HENT: Positive for congestion, postnasal drip and rhinorrhea. Negative for sinus pressure, sneezing and sore throat.   Eyes: Negative for visual disturbance.  Respiratory: Positive for cough and wheezing. Negative for chest tightness and shortness of breath.   Cardiovascular: Negative for chest pain, palpitations and leg swelling.  Gastrointestinal: Negative for nausea, vomiting and diarrhea.  Skin: Negative for rash.  Neurological: Negative for dizziness, weakness, numbness and headaches.  Psychiatric/Behavioral: The patient is not nervous/anxious.       Objective:   Physical Exam  Constitutional: She is oriented to person, place, and time. She appears well-developed and well-nourished. No distress.  BP 102/60 mmHg  Pulse 82  Temp(Src) 98.4 F (36.9 C)  Resp 16  Ht 5\' 9"  (1.753 m)  SpO2 98%   HENT:  Head: Normocephalic and atraumatic.  Right Ear: External ear normal.  Left Ear: External ear normal.  Cardiovascular: Normal rate, regular rhythm and normal heart sounds.   Pulmonary/Chest: Effort normal. No respiratory distress. She has wheezes. She has no rales. She exhibits no tenderness.  Rhonchi  Neurological: She is alert and oriented to person, place, and time. No cranial nerve deficit. She exhibits normal muscle tone. Coordination normal.  Skin: Skin is warm and dry. No rash noted. She  is not diaphoretic.  Psychiatric: She has a normal mood and affect. Her behavior is normal. Judgment and thought content normal.      Assessment & Plan:

## 2015-01-10 ENCOUNTER — Ambulatory Visit: Payer: BC Managed Care – PPO | Admitting: Nurse Practitioner

## 2015-01-15 ENCOUNTER — Encounter: Payer: Self-pay | Admitting: Nurse Practitioner

## 2015-01-15 NOTE — Assessment & Plan Note (Signed)
Pt has immunodeficiency (common variable). Avelox given to pt (she reports her immunologist gives her avelox when other antibiotics fail. Pt 98%, but rhonchi and wheezing on exam. Continue prednisone. Seek emergency care as needed. FU in 2-3 days if no improvement.

## 2015-01-16 ENCOUNTER — Ambulatory Visit (INDEPENDENT_AMBULATORY_CARE_PROVIDER_SITE_OTHER): Payer: BC Managed Care – PPO | Admitting: Internal Medicine

## 2015-01-16 ENCOUNTER — Encounter: Payer: Self-pay | Admitting: Internal Medicine

## 2015-01-16 VITALS — BP 108/62 | HR 82 | Temp 99.1°F | Resp 12 | Ht 69.0 in | Wt 121.8 lb

## 2015-01-16 DIAGNOSIS — E038 Other specified hypothyroidism: Secondary | ICD-10-CM

## 2015-01-16 DIAGNOSIS — D839 Common variable immunodeficiency, unspecified: Secondary | ICD-10-CM

## 2015-01-16 DIAGNOSIS — Z1239 Encounter for other screening for malignant neoplasm of breast: Secondary | ICD-10-CM | POA: Diagnosis not present

## 2015-01-16 DIAGNOSIS — Z8719 Personal history of other diseases of the digestive system: Secondary | ICD-10-CM

## 2015-01-16 DIAGNOSIS — Z1159 Encounter for screening for other viral diseases: Secondary | ICD-10-CM

## 2015-01-16 DIAGNOSIS — J40 Bronchitis, not specified as acute or chronic: Secondary | ICD-10-CM

## 2015-01-16 DIAGNOSIS — R5383 Other fatigue: Secondary | ICD-10-CM | POA: Diagnosis not present

## 2015-01-16 DIAGNOSIS — E785 Hyperlipidemia, unspecified: Secondary | ICD-10-CM

## 2015-01-16 DIAGNOSIS — E034 Atrophy of thyroid (acquired): Secondary | ICD-10-CM

## 2015-01-16 DIAGNOSIS — E559 Vitamin D deficiency, unspecified: Secondary | ICD-10-CM | POA: Diagnosis not present

## 2015-01-16 LAB — CBC WITH DIFFERENTIAL/PLATELET
BASOS PCT: 0.6 % (ref 0.0–3.0)
Basophils Absolute: 0 10*3/uL (ref 0.0–0.1)
Eosinophils Absolute: 0.2 10*3/uL (ref 0.0–0.7)
Eosinophils Relative: 3.8 % (ref 0.0–5.0)
HCT: 38.1 % (ref 36.0–46.0)
Hemoglobin: 12.8 g/dL (ref 12.0–15.0)
LYMPHS PCT: 11.4 % — AB (ref 12.0–46.0)
Lymphs Abs: 0.7 10*3/uL (ref 0.7–4.0)
MCHC: 33.5 g/dL (ref 30.0–36.0)
MCV: 84.6 fl (ref 78.0–100.0)
Monocytes Absolute: 0.5 10*3/uL (ref 0.1–1.0)
Monocytes Relative: 8 % (ref 3.0–12.0)
NEUTROS ABS: 4.6 10*3/uL (ref 1.4–7.7)
Neutrophils Relative %: 76.2 % (ref 43.0–77.0)
Platelets: 149 10*3/uL — ABNORMAL LOW (ref 150.0–400.0)
RBC: 4.51 Mil/uL (ref 3.87–5.11)
RDW: 15.8 % — ABNORMAL HIGH (ref 11.5–15.5)
WBC: 6.1 10*3/uL (ref 4.0–10.5)

## 2015-01-16 LAB — COMPREHENSIVE METABOLIC PANEL
ALT: 12 U/L (ref 0–35)
AST: 13 U/L (ref 0–37)
Albumin: 4.1 g/dL (ref 3.5–5.2)
Alkaline Phosphatase: 42 U/L (ref 39–117)
BILIRUBIN TOTAL: 0.5 mg/dL (ref 0.2–1.2)
BUN: 11 mg/dL (ref 6–23)
CALCIUM: 9 mg/dL (ref 8.4–10.5)
CHLORIDE: 105 meq/L (ref 96–112)
CO2: 28 mEq/L (ref 19–32)
CREATININE: 0.8 mg/dL (ref 0.40–1.20)
GFR: 81.94 mL/min (ref >60.00)
GLUCOSE: 74 mg/dL (ref 70–99)
POTASSIUM: 4 meq/L (ref 3.5–5.1)
Sodium: 140 mEq/L (ref 135–145)
TOTAL PROTEIN: 6.2 g/dL (ref 6.0–8.3)

## 2015-01-16 LAB — TSH: TSH: 4.96 u[IU]/mL — ABNORMAL HIGH (ref 0.35–4.50)

## 2015-01-16 LAB — VITAMIN D 25 HYDROXY (VIT D DEFICIENCY, FRACTURES): VITD: 18.73 ng/mL — ABNORMAL LOW (ref 30.00–100.00)

## 2015-01-16 LAB — LIPID PANEL
Cholesterol: 160 mg/dL (ref 0–200)
HDL: 50.5 mg/dL (ref >39.00)
LDL CALC: 95 mg/dL (ref 0–99)
NonHDL: 109.5
Total CHOL/HDL Ratio: 3
Triglycerides: 74 mg/dL (ref 0.0–149.0)
VLDL: 14.8 mg/dL (ref 0.0–40.0)

## 2015-01-16 NOTE — Progress Notes (Signed)
Pre visit review using our clinic review tool, if applicable. No additional management support is needed unless otherwise documented below in the visit note. 

## 2015-01-16 NOTE — Patient Instructions (Signed)
Your lungs sound back to normal.  I am repeating your thyroid function as well  Mammogram has been ordered,  The office will call you with the appointment

## 2015-01-16 NOTE — Progress Notes (Signed)
Subjective:  Patient ID: Ashley [name], female    DOB: [birth year]  Age: 44 y.o. MRN: [id number]  CC: The primary encounter diagnosis was Breast cancer screening. Diagnoses of Need for hepatitis C screening test, Vitamin D deficiency, Other fatigue, Hyperlipidemia, History of acute pancreatitis, Bronchitis, Common variable immunodeficiency, and Hypothyroidism due to acquired atrophy of thyroid were also pertinent to this visit.  HPI Ashley [name] presents for follow up  On chronic conditions.  1) recent episode of purulent bronchitis that failed to improve with z pack,  Followed by a course of levaquin.  She was finally able to resolve symptoms after taking avelox.  .  Cough has now resolved.   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  Sister has been diagnosed with CVID as well.   She was Hospitalized twice in 2015 for pneumonia and presumed pancreatitis with a fevers and intractible  nausea and vomiting. She had a brief hospitalization with an ultrasound which showed a small gallstone, but no GI or Gen Surg referral was done .  The hospitalization was at  [facility] in [place]  The illness was prolonged, resulting in a  20 lb wt loss (down to 100 lbs) over a 3 month period,  Before resolving  Sh ehas since gained the weight back.     Outpatient Prescriptions Prior to Visit  Medication Sig Dispense Refill  . albuterol (PROVENTIL) (2.5 MG/3ML) 0.083% nebulizer solution Take 2.5 mg by nebulization every 6 (six) hours as needed. For wheezing     . albuterol (VENTOLIN HFA) 108 (90 BASE) MCG/ACT inhaler Inhale 2 puffs into the lungs 4 (four) times daily as needed.      . clindamycin (CLEOCIN) 300 MG capsule Take 300 mg by mouth 3 (three) times daily.    . Immune Globulin, Human, (GAMUNEX IV) Inject into the vein once a week.      . levothyroxine (SYNTHROID, LEVOTHROID) 200 MCG tablet Take 200 mcg by mouth daily before breakfast.    . moxifloxacin  (AVELOX) 400 MG tablet Take 1 tablet (400 mg total) by mouth daily at 8 pm. 7 tablet 0  . predniSONE (STERAPRED UNI-PAK) 10 MG tablet Take 6 tablets by mouth daily. Taper     No facility-administered medications prior to visit.    Review of Systems;  Patient denies headache, fevers, malaise, unintentional weight loss, skin rash, eye pain, sinus congestion and sinus pain, sore throat, dysphagia,  hemoptysis , cough, dyspnea, wheezing, chest pain, palpitations, orthopnea, edema, abdominal pain, nausea, melena, diarrhea, constipation, flank pain, dysuria, hematuria, urinary  Frequency, nocturia, numbness, tingling, seizures,  Focal weakness, Loss of consciousness,  Tremor, insomnia, depression, anxiety, and suicidal ideation.      Objective:  BP 108/62 mmHg  Pulse 82  Temp(Src) 99.1 F (37.3 C) (Oral)  Resp 12  Ht  (1.753 m)  Wt 121 lb 12.8 oz (55.248 kg)  BMI 17.98 kg/m2  SpO2 97%  BP Readings from Last 3 Encounters:  01/16/15 108/62  01/03/15 102/60  07/29/13 110/60    Wt Readings from Last 3 Encounters:  01/16/15 121 lb 12.8 oz (55.248 kg)  07/29/13 128 lb 8 oz (58.287 kg)  05/26/12 124 lb 8 oz (56.473 kg)    General appearance: alert, cooperative and appears stated age Ears: normal TM's and external ear canals both ears Throat: lips, mucosa, and tongue normal; teeth and gums normal Neck: no adenopathy, no carotid bruit, supple, symmetrical, trachea  midline and thyroid not enlarged, symmetric, no tenderness/mass/nodules Back: symmetric, no curvature. ROM normal. No CVA tenderness. Lungs: clear to auscultation bilaterally Heart: regular rate and rhythm, S1, S2 normal, no murmur, click, rub or gallop Abdomen: soft, non-tender; bowel sounds normal; no masses,  no organomegaly Pulses: 2+ and symmetric Skin: Skin color, texture, turgor normal. No rashes or lesions Lymph nodes: Cervical, supraclavicular, and axillary nodes normal.  No results found for: HGBA1C  Lab  Results  Component Value Date   CREATININE 0.80 01/16/2015   CREATININE 0.72 07/31/2013   CREATININE 0.68 07/30/2013    Lab Results  Component Value Date   WBC 6.1 01/16/2015   HGB 12.8 01/16/2015   HCT 38.1 01/16/2015   PLT 149.0* 01/16/2015   GLUCOSE 74 01/16/2015   CHOL 160 01/16/2015   TRIG 74.0 01/16/2015   HDL 50.50 01/16/2015   LDLCALC 95 01/16/2015   ALT 12 01/16/2015   AST 13 01/16/2015   NA 140 01/16/2015   K 4.0 01/16/2015   CL 105 01/16/2015   CREATININE 0.80 01/16/2015   BUN 11 01/16/2015   CO2 28 01/16/2015   TSH 4.96* 01/16/2015   PSA normal done in [place]  01/15/2014    No results found.  Assessment & Plan:   Problem List Items Addressed This Visit      Unprioritized   Common variable immunodeficiency    Managed with weekly IViG      Hypothyroidism    Thyroid function is WNL on current dose.  No current changes needed.   Lab Results  Component Value Date   TSH 4.96* 01/16/2015         Bronchitis    Resolved after 3 rounds of antibiotics.       History of acute pancreatitis    Etiology unclear . Records requested.  Will consider repeating an ultrasound.        Other Visit Diagnoses    Breast cancer screening    -  Primary    Relevant Orders    MM DIGITAL SCREENING BILATERAL    Need for hepatitis C screening test        Relevant Orders    Hepatitis C antibody (Completed)    Vitamin D deficiency        Relevant Orders    Vit D  25 hydroxy (rtn osteoporosis monitoring) (Completed)    Other fatigue        Relevant Orders    CBC with Differential/Platelet (Completed)    Comprehensive metabolic panel (Completed)    TSH (Completed)    Hyperlipidemia        Relevant Orders    Lipid panel (Completed)       I have discontinued Ms. [name]'s predniSONE and moxifloxacin. I am also having her maintain her albuterol, albuterol, (Immune Globulin, Human, (GAMUNEX IV)), levothyroxine, and clindamycin.  No orders of the defined types were  placed in this encounter.    Medications Discontinued During This Encounter  Medication Reason  . moxifloxacin (AVELOX) 400 MG tablet Completed Course  . predniSONE (STERAPRED UNI-PAK) 10 MG tablet Completed Course    Follow-up: No Follow-up on file.   Sherlene ShamsULLO, Nirel Babler L, MD

## 2015-01-17 DIAGNOSIS — Z8719 Personal history of other diseases of the digestive system: Secondary | ICD-10-CM | POA: Insufficient documentation

## 2015-01-17 DIAGNOSIS — E785 Hyperlipidemia, unspecified: Secondary | ICD-10-CM | POA: Insufficient documentation

## 2015-01-17 LAB — HEPATITIS C ANTIBODY: HCV Ab: NEGATIVE

## 2015-01-17 NOTE — Assessment & Plan Note (Signed)
Normal by repeat check.   Lab Results  Component Value Date   CHOL 160 01/16/2015   HDL 50.50 01/16/2015   LDLCALC 95 01/16/2015   TRIG 74.0 01/16/2015   CHOLHDL 3 01/16/2015

## 2015-01-17 NOTE — Assessment & Plan Note (Signed)
Etiology unclear . Records requested.  Will consider repeating an ultrasound.

## 2015-01-17 NOTE — Assessment & Plan Note (Signed)
Managed with weekly IViG

## 2015-01-17 NOTE — Assessment & Plan Note (Signed)
Thyroid function is WNL on current dose.  No current changes needed.   Lab Results  Component Value Date   TSH 4.96* 01/16/2015

## 2015-01-17 NOTE — Assessment & Plan Note (Signed)
Resolved after 3 rounds of antibiotics.

## 2015-01-19 ENCOUNTER — Encounter: Payer: Self-pay | Admitting: Internal Medicine

## 2015-01-19 ENCOUNTER — Other Ambulatory Visit: Payer: Self-pay | Admitting: Internal Medicine

## 2015-01-19 MED ORDER — LEVOTHYROXINE SODIUM 25 MCG PO TABS: ORAL_TABLET | ORAL | Status: AC

## 2015-01-19 MED ORDER — ERGOCALCIFEROL 1.25 MG (50000 UT) PO CAPS: 50000.0000 [IU] | ORAL_CAPSULE | ORAL | Status: AC

## 2015-01-19 MED ORDER — LEVOTHYROXINE SODIUM 200 MCG PO TABS: ORAL_TABLET | ORAL | Status: AC

## 2020-08-01 ENCOUNTER — Encounter: Payer: Self-pay | Admitting: *Deleted
